# Patient Record
Sex: Male | Born: 1945 | Race: White | Hispanic: No | Marital: Married | Smoking: Never smoker
Health system: Southern US, Community
[De-identification: ages and names within clinical notes are randomized; demographics above are authoritative.]

## PROBLEM LIST (undated history)

## (undated) DIAGNOSIS — E785 Hyperlipidemia, unspecified: Secondary | ICD-10-CM

## (undated) DIAGNOSIS — T7840XA Allergy, unspecified, initial encounter: Secondary | ICD-10-CM

## (undated) DIAGNOSIS — E119 Type 2 diabetes mellitus without complications: Secondary | ICD-10-CM

## (undated) DIAGNOSIS — I10 Essential (primary) hypertension: Secondary | ICD-10-CM

## (undated) HISTORY — DX: Allergy, unspecified, initial encounter: T78.40XA

## (undated) HISTORY — DX: Essential (primary) hypertension: I10

## (undated) HISTORY — PX: APPENDECTOMY: SHX54

## (undated) HISTORY — PX: TONSILLECTOMY: SUR1361

## (undated) HISTORY — PX: COLON SURGERY: SHX602

## (undated) HISTORY — DX: Type 2 diabetes mellitus without complications: E11.9

## (undated) HISTORY — DX: Hyperlipidemia, unspecified: E78.5

---

## 2007-03-26 ENCOUNTER — Ambulatory Visit: Payer: Self-pay | Admitting: Gastroenterology

## 2013-04-11 DIAGNOSIS — A419 Sepsis, unspecified organism: Secondary | ICD-10-CM | POA: Insufficient documentation

## 2014-03-31 ENCOUNTER — Ambulatory Visit: Payer: Self-pay

## 2015-04-28 ENCOUNTER — Other Ambulatory Visit: Payer: Self-pay | Admitting: Family Medicine

## 2015-06-10 DIAGNOSIS — E118 Type 2 diabetes mellitus with unspecified complications: Secondary | ICD-10-CM | POA: Insufficient documentation

## 2015-06-10 DIAGNOSIS — E785 Hyperlipidemia, unspecified: Secondary | ICD-10-CM | POA: Insufficient documentation

## 2015-06-10 DIAGNOSIS — I10 Essential (primary) hypertension: Secondary | ICD-10-CM | POA: Insufficient documentation

## 2015-06-10 DIAGNOSIS — E119 Type 2 diabetes mellitus without complications: Secondary | ICD-10-CM | POA: Insufficient documentation

## 2015-06-11 ENCOUNTER — Ambulatory Visit (INDEPENDENT_AMBULATORY_CARE_PROVIDER_SITE_OTHER): Payer: BC Managed Care – PPO | Admitting: Family Medicine

## 2015-06-11 ENCOUNTER — Telehealth: Payer: Self-pay | Admitting: Family Medicine

## 2015-06-11 ENCOUNTER — Encounter: Payer: Self-pay | Admitting: Family Medicine

## 2015-06-11 ENCOUNTER — Other Ambulatory Visit: Payer: Self-pay | Admitting: Family Medicine

## 2015-06-11 VITALS — BP 112/70 | HR 61 | Temp 98.6°F | Ht 65.2 in | Wt 212.0 lb

## 2015-06-11 DIAGNOSIS — Z0000 Encounter for general adult medical examination without abnormal findings: Secondary | ICD-10-CM | POA: Diagnosis not present

## 2015-06-11 DIAGNOSIS — I10 Essential (primary) hypertension: Secondary | ICD-10-CM

## 2015-06-11 DIAGNOSIS — E119 Type 2 diabetes mellitus without complications: Secondary | ICD-10-CM

## 2015-06-11 DIAGNOSIS — E785 Hyperlipidemia, unspecified: Secondary | ICD-10-CM

## 2015-06-11 LAB — URINALYSIS, ROUTINE W REFLEX MICROSCOPIC
BILIRUBIN UA: NEGATIVE
Leukocytes, UA: NEGATIVE
NITRITE UA: NEGATIVE
Protein, UA: NEGATIVE
RBC UA: NEGATIVE
SPEC GRAV UA: 1.025 (ref 1.005–1.030)
Urobilinogen, Ur: 0.2 mg/dL (ref 0.2–1.0)
pH, UA: 5 (ref 5.0–7.5)

## 2015-06-11 LAB — BAYER DCA HB A1C WAIVED: HB A1C (BAYER DCA - WAIVED): 7.2 % — ABNORMAL HIGH (ref <7.0)

## 2015-06-11 MED ORDER — BENAZEPRIL HCL 40 MG PO TABS: 40.0000 mg | ORAL_TABLET | Freq: Every day | ORAL | Status: DC

## 2015-06-11 MED ORDER — CANAGLIFLOZIN-METFORMIN HCL 150-1000 MG PO TABS: 1.0000 | ORAL_TABLET | Freq: Two times a day (BID) | ORAL | Status: DC

## 2015-06-11 MED ORDER — GLUCOSE BLOOD VI STRP: ORAL_STRIP | Status: DC

## 2015-06-11 MED ORDER — SIMVASTATIN 20 MG PO TABS: 20.0000 mg | ORAL_TABLET | Freq: Every day | ORAL | Status: DC

## 2015-06-11 NOTE — Telephone Encounter (Signed)
Please send in his medications  Benazepril 40 mg  invokamet [id number]  One touch ultra test stips Simvastatin 20 mg  [facility] [address]

## 2015-06-11 NOTE — Assessment & Plan Note (Addendum)
The current medical regimen is effective;  continue present plan and medications. Discussed better control with lifestyle

## 2015-06-11 NOTE — Progress Notes (Signed)
BP 112/70 mmHg  Pulse 61  Temp(Src) 98.6 F (37 C)  Ht 5' 5.2" (1.656 m)  Wt 212 lb (96.163 kg)  BMI 35.07 kg/m2  SpO2 97%   Subjective:    Patient ID: Ralph Chapman, male    DOB: 03/09/1946, 69 y.o.   MRN: [id number]  HPI: Ralph Chapman is a 69 y.o. male  Chief Complaint  Patient presents with  . Annual Exam   patient doing well has lost 4 pounds continuing to exercise as having occasional blood sugars in the 70s and 80s. But no hypoglycemic spells Blood pressure doing well Cholesterol doing well no complaints from medications takes faithfully   Relevant past medical, surgical, family and social history reviewed and updated as indicated. Interim medical history since our last visit reviewed. Allergies and medications reviewed and updated.  Review of Systems  Constitutional: Negative.   HENT: Negative.   Eyes: Negative.   Respiratory: Negative.   Cardiovascular: Negative.   Gastrointestinal: Negative.   Endocrine: Negative.   Genitourinary: Negative.   Musculoskeletal: Negative.   Skin: Negative.   Allergic/Immunologic: Negative.   Neurological: Negative.   Hematological: Negative.   Psychiatric/Behavioral: Negative.     Per HPI unless specifically indicated above     Objective:    BP 112/70 mmHg  Pulse 61  Temp(Src) 98.6 F (37 C)  Ht 5' 5.2" (1.656 m)  Wt 212 lb (96.163 kg)  BMI 35.07 kg/m2  SpO2 97%  Wt Readings from Last 3 Encounters:  06/11/15 212 lb (96.163 kg)  02/12/15 216 lb (97.977 kg)    Physical Exam  Constitutional: He is oriented to person, place, and time. He appears well-developed and well-nourished.  HENT:  Head: Normocephalic and atraumatic.  Right Ear: External ear normal.  Left Ear: External ear normal.  Eyes: Conjunctivae and EOM are normal. Pupils are equal, round, and reactive to light.  Neck: Normal range of motion. Neck supple.  Cardiovascular: Normal rate, regular rhythm, normal heart sounds and intact distal  pulses.   Pulmonary/Chest: Effort normal and breath sounds normal.  Abdominal: Soft. Bowel sounds are normal. There is no splenomegaly or hepatomegaly.  Genitourinary: Rectum normal, prostate normal and penis normal.  Musculoskeletal: Normal range of motion.  Patient with 2 large corns on bunion and bunionette which were pared with 15 blade scalpel with marked relief in symptoms  Neurological: He is alert and oriented to person, place, and time. He has normal reflexes.  Skin: No rash noted. No erythema.  Psychiatric: He has a normal mood and affect. His behavior is normal. Judgment and thought content normal.    No results found for this or any previous visit.    Assessment & Plan:   Problem List Items Addressed This Visit      Cardiovascular and Mediastinum   Hypertension - Primary    The current medical regimen is effective;  continue present plan and medications.       Relevant Orders   Comprehensive metabolic panel   CBC with Differential/Platelet   Urinalysis, Routine w reflex microscopic (not at [facility])   TSH     Endocrine   Diabetes mellitus without complication    The current medical regimen is effective;  continue present plan and medications. Discussed better control with lifestyle       Relevant Orders   Bayer DCA Hb A1c Waived   CBC with Differential/Platelet   Urinalysis, Routine w reflex microscopic (not at [facility])   TSH  Other   Hyperlipidemia    The current medical regimen is effective;  continue present plan and medications.       Relevant Orders   Lipid panel   CBC with Differential/Platelet   Urinalysis, Routine w reflex microscopic (not at [facility])   TSH    Other Visit Diagnoses    PE (physical exam), annual        Relevant Orders    Comprehensive metabolic panel    Bayer DCA Hb A1c Waived    Lipid panel    CBC with Differential/Platelet    PSA    Urinalysis, Routine w reflex microscopic (not at [facility])    TSH        Follow up  plan: Return in about 3 months (around 09/10/2015), or if symptoms worsen or fail to improve, for Recheck medicines diabetes check hemoglobin A1c.

## 2015-06-11 NOTE — Assessment & Plan Note (Signed)
The current medical regimen is effective;  continue present plan and medications.  

## 2015-06-12 LAB — CBC WITH DIFFERENTIAL/PLATELET
BASOS ABS: 0.1 10*3/uL (ref 0.0–0.2)
Basos: 1 %
EOS (ABSOLUTE): 0.2 10*3/uL (ref 0.0–0.4)
Eos: 3 %
Hematocrit: 43.5 % (ref 37.5–51.0)
Hemoglobin: 14.7 g/dL (ref 12.6–17.7)
IMMATURE GRANULOCYTES: 0 %
Immature Grans (Abs): 0 10*3/uL (ref 0.0–0.1)
Lymphocytes Absolute: 1.7 10*3/uL (ref 0.7–3.1)
Lymphs: 24 %
MCH: 31.9 pg (ref 26.6–33.0)
MCHC: 33.8 g/dL (ref 31.5–35.7)
MCV: 94 fL (ref 79–97)
MONOS ABS: 0.6 10*3/uL (ref 0.1–0.9)
Monocytes: 9 %
NEUTROS PCT: 63 %
Neutrophils Absolute: 4.3 10*3/uL (ref 1.4–7.0)
Platelets: 283 10*3/uL (ref 150–379)
RBC: 4.61 x10E6/uL (ref 4.14–5.80)
RDW: 13.8 % (ref 12.3–15.4)
WBC: 6.9 10*3/uL (ref 3.4–10.8)

## 2015-06-12 LAB — LIPID PANEL
CHOL/HDL RATIO: 3.1 ratio (ref 0.0–5.0)
Cholesterol, Total: 163 mg/dL (ref 100–199)
HDL: 52 mg/dL (ref >39)
LDL Calculated: 86 mg/dL (ref 0–99)
Triglycerides: 124 mg/dL (ref 0–149)
VLDL CHOLESTEROL CAL: 25 mg/dL (ref 5–40)

## 2015-06-12 LAB — COMPREHENSIVE METABOLIC PANEL
ALBUMIN: 4.6 g/dL (ref 3.6–4.8)
ALT: 14 IU/L (ref 0–44)
AST: 20 IU/L (ref 0–40)
Albumin/Globulin Ratio: 2.2 (ref 1.1–2.5)
Alkaline Phosphatase: 64 IU/L (ref 39–117)
BILIRUBIN TOTAL: 0.8 mg/dL (ref 0.0–1.2)
BUN / CREAT RATIO: 18 (ref 10–22)
BUN: 17 mg/dL (ref 8–27)
CHLORIDE: 102 mmol/L (ref 97–108)
CO2: 21 mmol/L (ref 18–29)
Calcium: 9.7 mg/dL (ref 8.6–10.2)
Creatinine, Ser: 0.93 mg/dL (ref 0.76–1.27)
GFR calc non Af Amer: 85 mL/min/{1.73_m2} (ref >59)
GFR, EST AFRICAN AMERICAN: 98 mL/min/{1.73_m2} (ref >59)
Globulin, Total: 2.1 g/dL (ref 1.5–4.5)
Glucose: 88 mg/dL (ref 65–99)
Potassium: 4.8 mmol/L (ref 3.5–5.2)
Sodium: 142 mmol/L (ref 134–144)
TOTAL PROTEIN: 6.7 g/dL (ref 6.0–8.5)

## 2015-06-12 LAB — PSA: PROSTATE SPECIFIC AG, SERUM: 4.2 ng/mL — AB (ref 0.0–4.0)

## 2015-06-12 LAB — TSH: TSH: 1.6 u[IU]/mL (ref 0.450–4.500)

## 2015-06-14 ENCOUNTER — Encounter: Payer: Self-pay | Admitting: Family Medicine

## 2015-07-18 IMAGING — CR DG CHEST 2V
1 series · 2 of 2 positions shown · non-contrast
Comparison: None.

CLINICAL DATA: Cough.  History of pneumonia.

EXAM:
CHEST  2 VIEW

[Series 1: pa · 0.17mm/px · 2 of 2 slices shown]
[im 1/2]
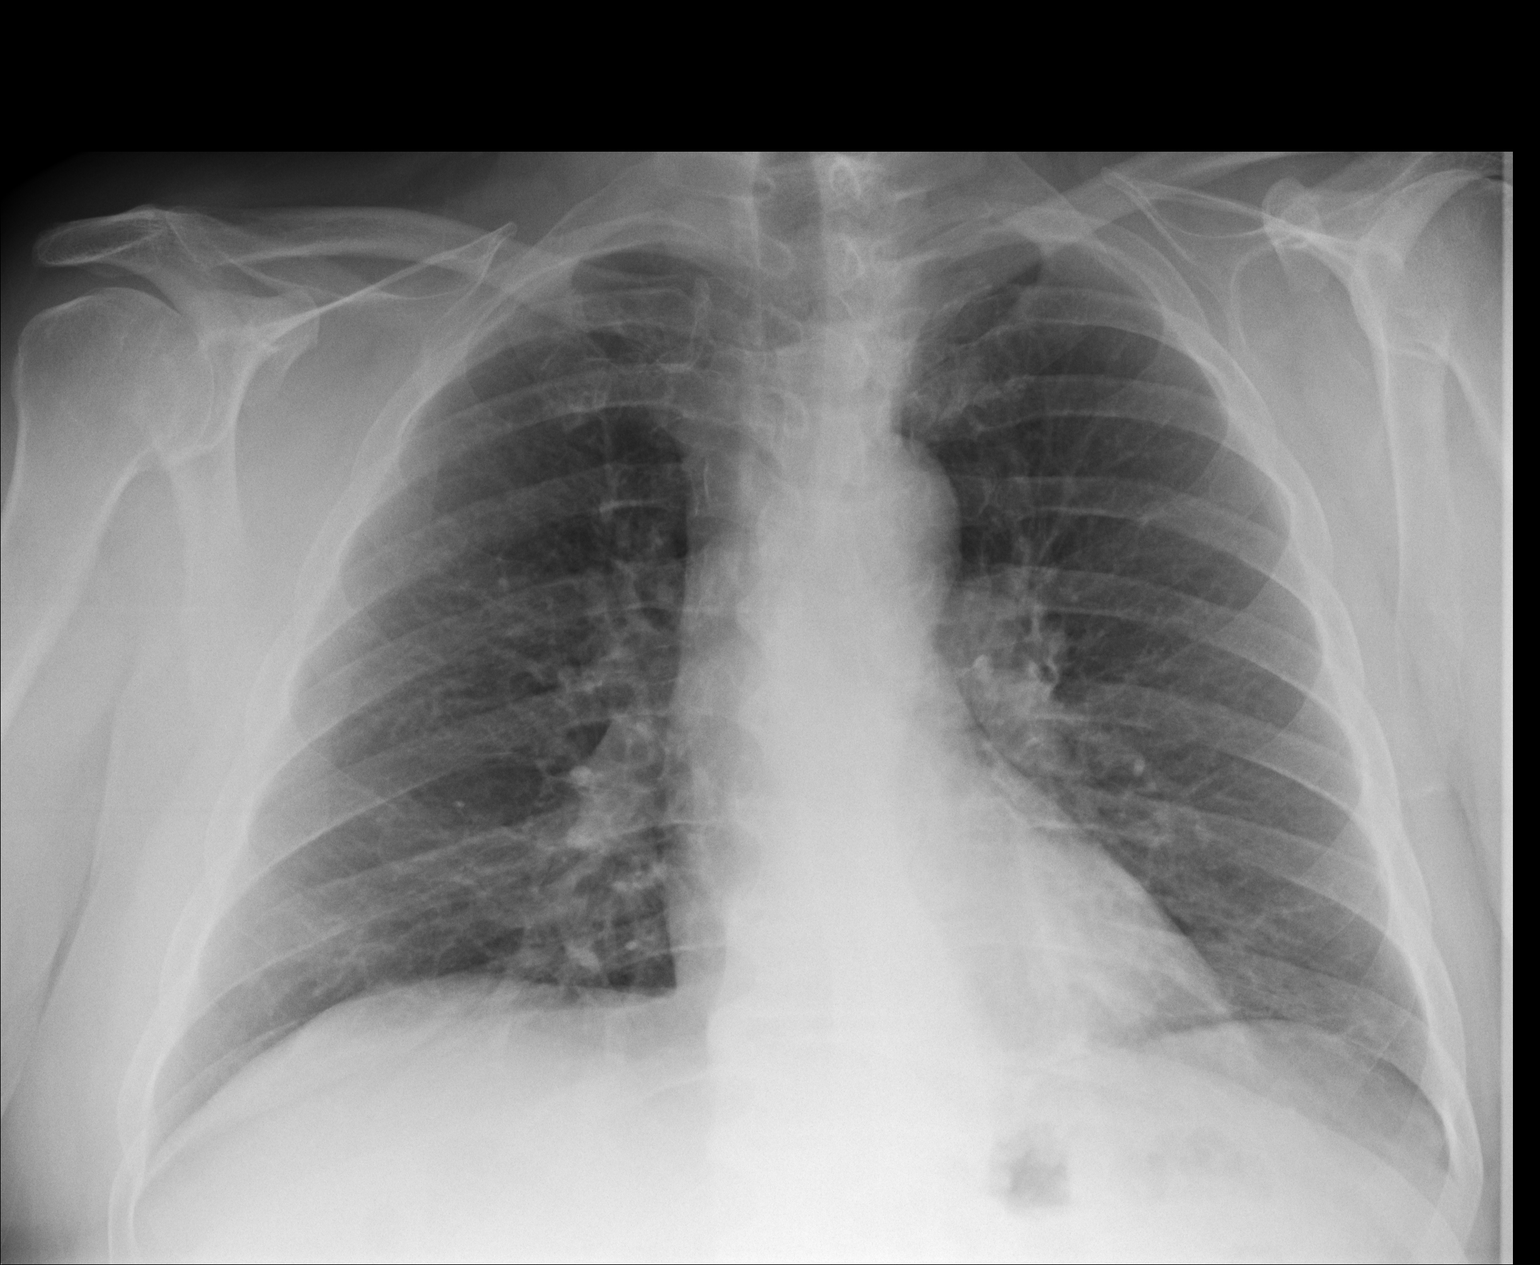
[im 2/2]
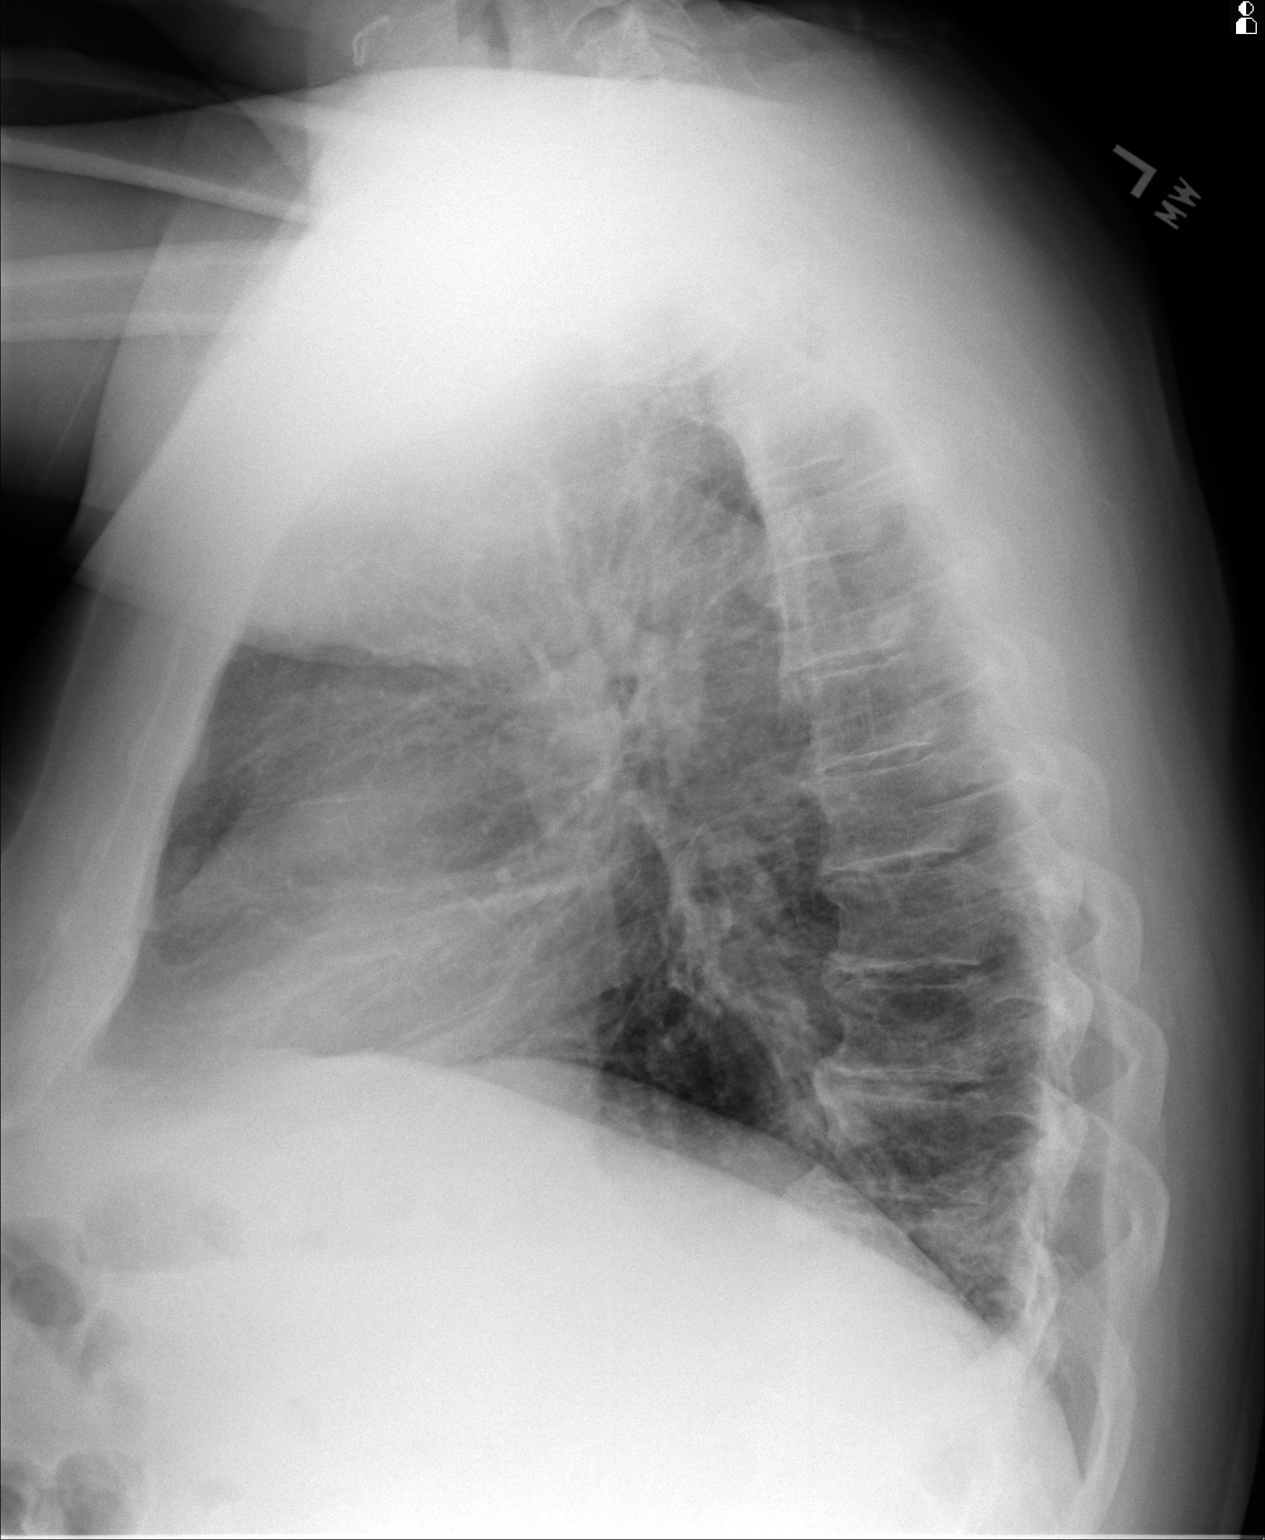

[2 of 2 positions shown; findings below may reference images not displayed]

FINDINGS: Cardiac silhouette is normal in size. Aorta is tortuous. Small
hiatal hernia is suggested. No mediastinal or hilar masses or
evidence of adenopathy.

Clear lungs.  No pleural effusion or pneumothorax.

Bony thorax is demineralized but intact.
IMPRESSION: No acute cardiopulmonary disease.

## 2015-09-15 ENCOUNTER — Encounter: Payer: Self-pay | Admitting: Family Medicine

## 2015-09-15 ENCOUNTER — Ambulatory Visit (INDEPENDENT_AMBULATORY_CARE_PROVIDER_SITE_OTHER): Payer: BC Managed Care – PPO | Admitting: Family Medicine

## 2015-09-15 VITALS — BP 123/75 | HR 67 | Temp 98.5°F | Ht 65.0 in | Wt 220.0 lb

## 2015-09-15 DIAGNOSIS — E785 Hyperlipidemia, unspecified: Secondary | ICD-10-CM

## 2015-09-15 DIAGNOSIS — E119 Type 2 diabetes mellitus without complications: Secondary | ICD-10-CM | POA: Diagnosis not present

## 2015-09-15 DIAGNOSIS — I10 Essential (primary) hypertension: Secondary | ICD-10-CM

## 2015-09-15 LAB — BAYER DCA HB A1C WAIVED: HB A1C (BAYER DCA - WAIVED): 6.5 % (ref <7.0)

## 2015-09-15 NOTE — Assessment & Plan Note (Signed)
The current medical regimen is effective;  continue present plan and medications.  

## 2015-09-15 NOTE — Assessment & Plan Note (Addendum)
The current medical regimen is effective;  continue present plan and medications. Patient will do his homework find out which medication is more cost effective

## 2015-09-15 NOTE — Progress Notes (Signed)
BP 123/75 mmHg  Pulse 67  Temp(Src) 98.5 F (36.9 C)  Ht 5\' 5"  (1.651 m)  Wt 220 lb (99.791 kg)  BMI 36.61 kg/m2  SpO2 97%   Subjective:    Patient ID: Ralph Chapman, male    DOB: 16-Aug-1946, 69 y.o.   MRN: [id number]  HPI: Ralph Chapman is a 69 y.o. male  Chief Complaint  Patient presents with  . Diabetes     Patient doing well with medications no low blood sugar spells concerned insurance is changing this winter will need to change medication Cholesterol doing well no complaints from medication taking medications faithfully  Relevant past medical, surgical, family and social history reviewed and updated as indicated. Interim medical history since our last visit reviewed. Allergies and medications reviewed and updated.  Review of Systems  Constitutional: Negative.   Respiratory: Negative.   Cardiovascular: Negative.     Per HPI unless specifically indicated above     Objective:    BP 123/75 mmHg  Pulse 67  Temp(Src) 98.5 F (36.9 C)  Ht 5\' 5"  (1.651 m)  Wt 220 lb (99.791 kg)  BMI 36.61 kg/m2  SpO2 97%  Wt Readings from Last 3 Encounters:  09/15/15 220 lb (99.791 kg)  06/11/15 212 lb (96.163 kg)  02/12/15 216 lb (97.977 kg)    Physical Exam  Constitutional: He is oriented to person, place, and time. He appears well-developed and well-nourished. No distress.  HENT:  Head: Normocephalic and atraumatic.  Right Ear: Hearing normal.  Left Ear: Hearing normal.  Nose: Nose normal.  Eyes: Conjunctivae and lids are normal. Right eye exhibits no discharge. Left eye exhibits no discharge. No scleral icterus.  Cardiovascular: Normal rate, regular rhythm and normal heart sounds.   Pulmonary/Chest: Effort normal and breath sounds normal. No respiratory distress.  Musculoskeletal: Normal range of motion.  Neurological: He is alert and oriented to person, place, and time.  Skin: Skin is intact. No rash noted.  Psychiatric: He has a normal mood and affect. His  speech is normal and behavior is normal. Judgment and thought content normal. Cognition and memory are normal.    Results for orders placed or performed in visit on 06/11/15  Comprehensive metabolic panel  Result Value Ref Range   Glucose 88 65 - 99 mg/dL   BUN 17 8 - 27 mg/dL   Creatinine, Ser 0.93 0.76 - 1.27 mg/dL   GFR calc non Af Amer 85 >59 mL/min/1.73   GFR calc Af Amer 98 >59 mL/min/1.73   BUN/Creatinine Ratio 18 10 - 22   Sodium 142 134 - 144 mmol/L   Potassium 4.8 3.5 - 5.2 mmol/L   Chloride 102 97 - 108 mmol/L   CO2 21 18 - 29 mmol/L   Calcium 9.7 8.6 - 10.2 mg/dL   Total Protein 6.7 6.0 - 8.5 g/dL   Albumin 4.6 3.6 - 4.8 g/dL   Globulin, Total 2.1 1.5 - 4.5 g/dL   Albumin/Globulin Ratio 2.2 1.1 - 2.5   Bilirubin Total 0.8 0.0 - 1.2 mg/dL   Alkaline Phosphatase 64 39 - 117 IU/L   AST 20 0 - 40 IU/L   ALT 14 0 - 44 IU/L  Bayer DCA Hb A1c Waived  Result Value Ref Range   Bayer DCA Hb A1c Waived 7.2 (H) <7.0 %  Lipid panel  Result Value Ref Range   Cholesterol, Total 163 100 - 199 mg/dL   Triglycerides 124 0 - 149 mg/dL   HDL 52 >39  mg/dL   VLDL Cholesterol Cal 25 5 - 40 mg/dL   LDL Calculated 86 0 - 99 mg/dL   Chol/HDL Ratio 3.1 0.0 - 5.0 ratio units  CBC with Differential/Platelet  Result Value Ref Range   WBC 6.9 3.4 - 10.8 x10E3/uL   RBC 4.61 4.14 - 5.80 x10E6/uL   Hemoglobin 14.7 12.6 - 17.7 g/dL   Hematocrit 43.5 37.5 - 51.0 %   MCV 94 79 - 97 fL   MCH 31.9 26.6 - 33.0 pg   MCHC 33.8 31.5 - 35.7 g/dL   RDW 13.8 12.3 - 15.4 %   Platelets 283 150 - 379 x10E3/uL   Neutrophils 63 %   Lymphs 24 %   Monocytes 9 %   Eos 3 %   Basos 1 %   Neutrophils Absolute 4.3 1.4 - 7.0 x10E3/uL   Lymphocytes Absolute 1.7 0.7 - 3.1 x10E3/uL   Monocytes Absolute 0.6 0.1 - 0.9 x10E3/uL   EOS (ABSOLUTE) 0.2 0.0 - 0.4 x10E3/uL   Basophils Absolute 0.1 0.0 - 0.2 x10E3/uL   Immature Granulocytes 0 %   Immature Grans (Abs) 0.0 0.0 - 0.1 x10E3/uL  PSA  Result Value Ref  Range   Prostate Specific Ag, Serum 4.2 (H) 0.0 - 4.0 ng/mL  Urinalysis, Routine w reflex microscopic (not at [facility])  Result Value Ref Range   Specific Gravity, UA 1.025 1.005 - 1.030   pH, UA 5.0 5.0 - 7.5   Color, UA Yellow Yellow   Appearance Ur Clear Clear   Leukocytes, UA Negative Negative   Protein, UA Negative Negative/Trace   Glucose, UA 3+ (A) Negative   Ketones, UA 1+ (A) Negative   RBC, UA Negative Negative   Bilirubin, UA Negative Negative   Urobilinogen, Ur 0.2 0.2 - 1.0 mg/dL   Nitrite, UA Negative Negative  TSH  Result Value Ref Range   TSH 1.600 0.450 - 4.500 uIU/mL      Assessment & Plan:   Problem List Items Addressed This Visit      Cardiovascular and Mediastinum   Hypertension    The current medical regimen is effective;  continue present plan and medications.         Endocrine   Diabetes mellitus without complication ([place]) - Primary    The current medical regimen is effective;  continue present plan and medications. Patient will do his homework find out which medication is more cost effective      Relevant Orders   Bayer DCA Hb A1c Waived     Other   Hyperlipidemia    The current medical regimen is effective;  continue present plan and medications.           Follow up plan: Return in about 3 months (around 12/14/2015) for A1C lipids, alt, ast, BMP.

## 2015-10-27 ENCOUNTER — Telehealth: Payer: Self-pay | Admitting: Family Medicine

## 2015-10-27 NOTE — Telephone Encounter (Signed)
Pt needs prior auth on invokamet. Number to call is [phone]

## 2015-10-29 NOTE — Telephone Encounter (Signed)
I've tried calling patient and pharmacy. No answer by patient and on hold for long time with pharmacy. I'm going to do the PA without the form from the pharmacy, but I need his new insurance card information for the new year.  Specifically the Rx Bin and Member ID number.  He's no longer active with the insurance information we have on file.

## 2015-10-30 NOTE — Telephone Encounter (Signed)
Pt's called stated he needs this medication today.   His current insurance is as follows[facility]orella [facility]rod [facility] Group # [id number] Subscriber# [id number]   New insurance information starting Feb.1, 2017:  [facility] Medicare Group# [id number] Subscriber # [id number]   Pt asks if someone can call him when this is completed as he needs this medication ASAP.  Thanks.

## 2015-11-02 NOTE — Telephone Encounter (Signed)
Patient received coupon. I still did a PA. Result is pending.

## 2015-12-15 ENCOUNTER — Ambulatory Visit (INDEPENDENT_AMBULATORY_CARE_PROVIDER_SITE_OTHER): Payer: Medicare Other | Admitting: Family Medicine

## 2015-12-15 ENCOUNTER — Encounter: Payer: Self-pay | Admitting: Family Medicine

## 2015-12-15 VITALS — BP 110/74 | HR 70 | Temp 98.9°F | Ht 65.4 in | Wt 218.0 lb

## 2015-12-15 DIAGNOSIS — E119 Type 2 diabetes mellitus without complications: Secondary | ICD-10-CM

## 2015-12-15 DIAGNOSIS — I10 Essential (primary) hypertension: Secondary | ICD-10-CM

## 2015-12-15 DIAGNOSIS — Z113 Encounter for screening for infections with a predominantly sexual mode of transmission: Secondary | ICD-10-CM | POA: Diagnosis not present

## 2015-12-15 DIAGNOSIS — E785 Hyperlipidemia, unspecified: Secondary | ICD-10-CM

## 2015-12-15 LAB — LP+ALT+AST PICCOLO, WAIVED
ALT (SGPT) Piccolo, Waived: 18 U/L (ref 10–47)
AST (SGOT) Piccolo, Waived: 27 U/L (ref 11–38)
CHOL/HDL RATIO PICCOLO,WAIVE: 2.9 mg/dL
CHOLESTEROL PICCOLO, WAIVED: 155 mg/dL (ref <200)
HDL Chol Piccolo, Waived: 53 mg/dL — ABNORMAL LOW (ref >59)
LDL CHOL CALC PICCOLO WAIVED: 77 mg/dL (ref <100)
Triglycerides Piccolo,Waived: 125 mg/dL (ref <150)
VLDL CHOL CALC PICCOLO,WAIVE: 25 mg/dL (ref <30)

## 2015-12-15 LAB — BAYER DCA HB A1C WAIVED: HB A1C: 7 % — AB (ref <7.0)

## 2015-12-15 NOTE — Assessment & Plan Note (Signed)
The current medical regimen is effective;  continue present plan and medications.  

## 2015-12-15 NOTE — Progress Notes (Signed)
BP 110/74 mmHg  Pulse 70  Temp(Src) 98.9 F (37.2 C)  Ht 5' 5.4" (1.661 m)  Wt 218 lb (98.884 kg)  BMI 35.84 kg/m2  SpO2 97%   Subjective:    Patient ID: Ralph Chapman, male    DOB: 11-18-1945, 70 y.o.   MRN: [id number]  HPI: Ralph Chapman is a 70 y.o. male  Chief Complaint  Patient presents with  . Diabetes  . Hypertension  . Hyperlipidemia   patient doing well with no complaints taking medications no side effects taking faithfully good blood pressure good blood sugar readings No myalgias from cholesterol medicines  Relevant past medical, surgical, family and social history reviewed and updated as indicated. Interim medical history since our last visit reviewed. Allergies and medications reviewed and updated.  Review of Systems  Constitutional: Negative.   Respiratory: Negative.   Cardiovascular: Negative.     Per HPI unless specifically indicated above     Objective:    BP 110/74 mmHg  Pulse 70  Temp(Src) 98.9 F (37.2 C)  Ht 5' 5.4" (1.661 m)  Wt 218 lb (98.884 kg)  BMI 35.84 kg/m2  SpO2 97%  Wt Readings from Last 3 Encounters:  12/15/15 218 lb (98.884 kg)  09/15/15 220 lb (99.791 kg)  06/11/15 212 lb (96.163 kg)    Physical Exam  Constitutional: He is oriented to person, place, and time. He appears well-developed and well-nourished. No distress.  HENT:  Head: Normocephalic and atraumatic.  Right Ear: Hearing normal.  Left Ear: Hearing normal.  Nose: Nose normal.  Eyes: Conjunctivae and lids are normal. Right eye exhibits no discharge. Left eye exhibits no discharge. No scleral icterus.  Cardiovascular: Normal rate, regular rhythm and normal heart sounds.   Pulmonary/Chest: Effort normal and breath sounds normal. No respiratory distress.  Musculoskeletal: Normal range of motion.  Neurological: He is alert and oriented to person, place, and time.  Skin: Skin is intact. No rash noted.  Psychiatric: He has a normal mood and affect. His speech is  normal and behavior is normal. Judgment and thought content normal. Cognition and memory are normal.    Results for orders placed or performed in visit on 09/15/15  Bayer DCA Hb A1c Waived  Result Value Ref Range   Bayer DCA Hb A1c Waived 6.5 <7.0 %      Assessment & Plan:   Problem List Items Addressed This Visit      Cardiovascular and Mediastinum   Hypertension    The current medical regimen is effective;  continue present plan and medications.       Relevant Orders   Bayer DCA Hb A1c Waived   LP+ALT+AST [place], Waived   Basic metabolic panel     Endocrine   Diabetes mellitus without complication ([place]) - Primary    The current medical regimen is effective;  continue present plan and medications.       Relevant Orders   Bayer DCA Hb A1c Waived   LP+ALT+AST [place], Waived   Basic metabolic panel     Other   Hyperlipidemia    The current medical regimen is effective;  continue present plan and medications.       Relevant Orders   Bayer DCA Hb A1c Waived   LP+ALT+AST [place], Waived   Basic metabolic panel    Other Visit Diagnoses    Routine screening for STI (sexually transmitted infection)        Relevant Orders    Hepatitis C Antibody  Follow up plan: Return in about 3 months (around 03/16/2016), or if symptoms worsen or fail to improve, for A1c.

## 2015-12-16 ENCOUNTER — Encounter: Payer: Self-pay | Admitting: Family Medicine

## 2015-12-16 LAB — BASIC METABOLIC PANEL
BUN/Creatinine Ratio: 20 (ref 10–22)
BUN: 19 mg/dL (ref 8–27)
CO2: 21 mmol/L (ref 18–29)
CREATININE: 0.94 mg/dL (ref 0.76–1.27)
Calcium: 9.5 mg/dL (ref 8.6–10.2)
Chloride: 104 mmol/L (ref 96–106)
GFR calc Af Amer: 96 mL/min/{1.73_m2} (ref >59)
GFR, EST NON AFRICAN AMERICAN: 83 mL/min/{1.73_m2} (ref >59)
Glucose: 137 mg/dL — ABNORMAL HIGH (ref 65–99)
Potassium: 4.8 mmol/L (ref 3.5–5.2)
SODIUM: 143 mmol/L (ref 134–144)

## 2015-12-16 LAB — HEPATITIS C ANTIBODY: Hep C Virus Ab: <0.1 s/co ratio (ref 0.0–0.9)

## 2016-03-08 LAB — HM DIABETES EYE EXAM

## 2016-03-16 ENCOUNTER — Encounter: Payer: Self-pay | Admitting: Family Medicine

## 2016-03-16 ENCOUNTER — Ambulatory Visit (INDEPENDENT_AMBULATORY_CARE_PROVIDER_SITE_OTHER): Payer: Medicare Other | Admitting: Family Medicine

## 2016-03-16 VITALS — BP 108/69 | HR 67 | Temp 98.1°F | Ht 65.4 in | Wt 215.0 lb

## 2016-03-16 DIAGNOSIS — E785 Hyperlipidemia, unspecified: Secondary | ICD-10-CM | POA: Diagnosis not present

## 2016-03-16 DIAGNOSIS — E119 Type 2 diabetes mellitus without complications: Secondary | ICD-10-CM | POA: Diagnosis not present

## 2016-03-16 DIAGNOSIS — I10 Essential (primary) hypertension: Secondary | ICD-10-CM

## 2016-03-16 LAB — BAYER DCA HB A1C WAIVED: HB A1C: 6.7 % (ref <7.0)

## 2016-03-16 NOTE — Assessment & Plan Note (Signed)
The current medical regimen is effective;  continue present plan and medications.  

## 2016-03-16 NOTE — Assessment & Plan Note (Signed)
Discussed treatment goals and medications patient may try decreasing Benzapril 20 mg observing blood pressure and blood pressure response.

## 2016-03-16 NOTE — Progress Notes (Signed)
   BP 108/69 mmHg  Pulse 67  Temp(Src) 98.1 F (36.7 C)  Ht 5' 5.4" (1.661 m)  Wt 215 lb (97.523 kg)  BMI 35.35 kg/m2  SpO2 98%   Subjective:    Patient ID: Ralph Chapman, male    DOB: 11/18/1945, 70 y.o.   MRN: [id number]  HPI: Ralph Chapman is a 70 y.o. male  Chief Complaint  Patient presents with  . Diabetes   Patient doing well with diabetes no complaints has had good weight loss exercising well and eating better. Feeling very good. No low blood sugar spells. Blood pressures been doing well is been low but no low blood pressure symptoms no orthostatic changes patient interested in maybe decreasing medications. Cholesterol doing well Relevant past medical, surgical, family and social history reviewed and updated as indicated. Interim medical history since our last visit reviewed. Allergies and medications reviewed and updated.  Review of Systems  Constitutional: Negative.   Respiratory: Negative.   Cardiovascular: Negative.     Per HPI unless specifically indicated above     Objective:    BP 108/69 mmHg  Pulse 67  Temp(Src) 98.1 F (36.7 C)  Ht 5' 5.4" (1.661 m)  Wt 215 lb (97.523 kg)  BMI 35.35 kg/m2  SpO2 98%  Wt Readings from Last 3 Encounters:  03/16/16 215 lb (97.523 kg)  12/15/15 218 lb (98.884 kg)  09/15/15 220 lb (99.791 kg)    Physical Exam  Constitutional: He is oriented to person, place, and time. He appears well-developed and well-nourished. No distress.  HENT:  Head: Normocephalic and atraumatic.  Right Ear: Hearing normal.  Left Ear: Hearing normal.  Nose: Nose normal.  Eyes: Conjunctivae and lids are normal. Right eye exhibits no discharge. Left eye exhibits no discharge. No scleral icterus.  Cardiovascular: Normal rate, regular rhythm and normal heart sounds.   Pulmonary/Chest: Effort normal and breath sounds normal. No respiratory distress.  Musculoskeletal: Normal range of motion.  Neurological: He is alert and oriented to person,  place, and time.  Skin: Skin is intact. No rash noted.  Psychiatric: He has a normal mood and affect. His speech is normal and behavior is normal. Judgment and thought content normal. Cognition and memory are normal.    Results for orders placed or performed in visit on 03/09/16  HM DIABETES EYE EXAM  Result Value Ref Range   HM Diabetic Eye Exam No Retinopathy No Retinopathy      Assessment & Plan:   Problem List Items Addressed This Visit      Cardiovascular and Mediastinum   Hypertension    Discussed treatment goals and medications patient may try decreasing Benzapril 20 mg observing blood pressure and blood pressure response.        Endocrine   Diabetes mellitus without complication ([place]) - Primary    The current medical regimen is effective;  continue present plan and medications.       Relevant Orders   Bayer DCA Hb A1c Waived     Other   Hyperlipidemia    The current medical regimen is effective;  continue present plan and medications.           Follow up plan: Return in about 3 months (around 06/16/2016) for Physical Exam a1c.

## 2016-08-01 ENCOUNTER — Telehealth: Payer: Self-pay

## 2016-08-01 ENCOUNTER — Encounter: Payer: Self-pay | Admitting: Family Medicine

## 2016-08-01 ENCOUNTER — Ambulatory Visit (INDEPENDENT_AMBULATORY_CARE_PROVIDER_SITE_OTHER): Payer: Medicare Other | Admitting: Family Medicine

## 2016-08-01 VITALS — BP 113/71 | HR 76 | Temp 98.3°F | Ht 66.0 in | Wt 214.0 lb

## 2016-08-01 DIAGNOSIS — E119 Type 2 diabetes mellitus without complications: Secondary | ICD-10-CM

## 2016-08-01 DIAGNOSIS — Z0000 Encounter for general adult medical examination without abnormal findings: Secondary | ICD-10-CM | POA: Diagnosis not present

## 2016-08-01 DIAGNOSIS — R195 Other fecal abnormalities: Secondary | ICD-10-CM

## 2016-08-01 DIAGNOSIS — Z23 Encounter for immunization: Secondary | ICD-10-CM | POA: Diagnosis not present

## 2016-08-01 DIAGNOSIS — I10 Essential (primary) hypertension: Secondary | ICD-10-CM

## 2016-08-01 DIAGNOSIS — E7800 Pure hypercholesterolemia, unspecified: Secondary | ICD-10-CM

## 2016-08-01 MED ORDER — SIMVASTATIN 20 MG PO TABS: 20.0000 mg | ORAL_TABLET | Freq: Every day | ORAL | 4 refills | Status: DC

## 2016-08-01 MED ORDER — BENAZEPRIL HCL 40 MG PO TABS: 40.0000 mg | ORAL_TABLET | Freq: Every day | ORAL | 4 refills | Status: DC

## 2016-08-01 MED ORDER — CANAGLIFLOZIN-METFORMIN HCL 150-1000 MG PO TABS
1.0000 | ORAL_TABLET | Freq: Two times a day (BID) | ORAL | 4 refills | Status: DC
Start: 2016-08-01 — End: 2016-11-08

## 2016-08-01 MED ORDER — BENAZEPRIL HCL 40 MG PO TABS: 40.0000 mg | ORAL_TABLET | Freq: Every day | ORAL | 12 refills | Status: DC

## 2016-08-01 MED ORDER — CANAGLIFLOZIN-METFORMIN HCL 150-1000 MG PO TABS: 1.0000 | ORAL_TABLET | Freq: Two times a day (BID) | ORAL | 12 refills | Status: DC

## 2016-08-01 MED ORDER — SIMVASTATIN 20 MG PO TABS: 20.0000 mg | ORAL_TABLET | Freq: Every day | ORAL | 12 refills | Status: DC

## 2016-08-01 NOTE — Assessment & Plan Note (Signed)
The current medical regimen is effective;  continue present plan and medications.  

## 2016-08-01 NOTE — Progress Notes (Signed)
BP 113/71   Pulse 76   Temp 98.3 F (36.8 C)   Ht _0  (1.676 m)   Wt 214 lb (97.1 kg)   SpO2 95%   BMI 34.54 kg/m    Subjective:    Patient ID: Ralph Chapman, male    DOB: 12-Nov-1945, 70 y.o.   MRN: [id number]  HPI: Ralph Chapman is a 70 y.o. male  Chief Complaint  Patient presents with  . Annual Exam  AWV metrics met Pt with insurance given Stool cards came back positive will get colonoscopy. Patient follow-up also for cholesterol doing well with no complaints from medications taken faithfully same for diabetes and blood pressure no problems with medications noted low blood sugar spells and taking faithfully. Takes glucosamine for arthritis complaints does okay. As noted above patient was giving stool for occult blood card by this in nurse for his insurance which came back positive will set up for colonoscopy. Discuss concerns about Invokanna and will leave alone patient with no leg foot issues ulcerations or risk factors. Relevant past medical, surgical, family and social history reviewed and updated as indicated. Interim medical history since our last visit reviewed. Allergies and medications reviewed and updated.  Review of Systems  Constitutional: Negative.   HENT: Negative.   Eyes: Negative.   Respiratory: Negative.   Cardiovascular: Negative.   Gastrointestinal: Negative.   Endocrine: Negative.   Genitourinary: Negative.   Musculoskeletal: Negative.   Skin: Negative.   Allergic/Immunologic: Negative.   Neurological: Negative.   Hematological: Negative.   Psychiatric/Behavioral: Negative.     Per HPI unless specifically indicated above     Objective:    BP 113/71   Pulse 76   Temp 98.3 F (36.8 C)   Ht _1  (1.676 m)   Wt 214 lb (97.1 kg)   SpO2 95%   BMI 34.54 kg/m   Wt Readings from Last 3 Encounters:  08/01/16 214 lb (97.1 kg)  03/16/16 215 lb (97.5 kg)  12/15/15 218 lb (98.9 kg)    Physical Exam  Constitutional: He is oriented to  person, place, and time. He appears well-developed and well-nourished.  HENT:  Head: Normocephalic and atraumatic.  Right Ear: External ear normal.  Left Ear: External ear normal.  Eyes: Conjunctivae and EOM are normal. Pupils are equal, round, and reactive to light.  Neck: Normal range of motion. Neck supple.  Cardiovascular: Normal rate, regular rhythm, normal heart sounds and intact distal pulses.   Pulmonary/Chest: Effort normal and breath sounds normal.  Abdominal: Soft. Bowel sounds are normal. There is no splenomegaly or hepatomegaly.  Genitourinary: Rectum normal and penis normal.  Genitourinary Comments: Prostate enlarged  Musculoskeletal: Normal range of motion.  Neurological: He is alert and oriented to person, place, and time. He has normal reflexes.  Skin: No rash noted. No erythema.  Psychiatric: He has a normal mood and affect. His behavior is normal. Judgment and thought content normal.    Results for orders placed or performed in visit on 03/16/16  Bayer DCA Hb A1c Waived  Result Value Ref Range   Bayer DCA Hb A1c Waived 6.7 <7.0 %      Assessment & Plan:   Problem List Items Addressed This Visit      Cardiovascular and Mediastinum   Hypertension    The current medical regimen is effective;  continue present plan and medications.       Relevant Medications   simvastatin (ZOCOR) 20 MG tablet   benazepril (LOTENSIN)  40 MG tablet     Endocrine   Diabetes mellitus without complication ([place])    The current medical regimen is effective;  continue present plan and medications.       Relevant Medications   Canagliflozin-Metformin HCl (INVOKAMET) [phone] MG TABS   simvastatin (ZOCOR) 20 MG tablet   benazepril (LOTENSIN) 40 MG tablet     Other   Hyperlipidemia    The current medical regimen is effective;  continue present plan and medications.       Relevant Medications   simvastatin (ZOCOR) 20 MG tablet   benazepril (LOTENSIN) 40 MG tablet   Hematest  positive stools   Relevant Orders   Ambulatory referral to Gastroenterology    Other Visit Diagnoses    Needs flu shot    -  Primary   Encounter for immunization       Relevant Orders   Flu vaccine HIGH DOSE PF (Completed)   Ambulatory referral to Gastroenterology   PE (physical exam), annual           Follow up plan: Return in about 3 months (around 11/01/2016) for Hemoglobin A1c.

## 2016-08-01 NOTE — Addendum Note (Signed)
Addended byGolden Pop on: 08/01/2016 01:40 PM   Modules accepted: Orders

## 2016-08-01 NOTE — Telephone Encounter (Signed)
Patient wants to know if he can also get a PSA checked today?

## 2016-08-01 NOTE — Telephone Encounter (Signed)
labs

## 2016-08-02 ENCOUNTER — Encounter: Payer: Self-pay | Admitting: Family Medicine

## 2016-08-02 LAB — HGB A1C W/O EAG: Hgb A1c MFr Bld: 6.9 % — ABNORMAL HIGH (ref 4.8–5.6)

## 2016-08-03 NOTE — Telephone Encounter (Signed)
Patient notified, he will come back in for labs.

## 2016-08-04 ENCOUNTER — Other Ambulatory Visit: Payer: Medicare Other

## 2016-08-04 DIAGNOSIS — E7800 Pure hypercholesterolemia, unspecified: Secondary | ICD-10-CM

## 2016-08-04 DIAGNOSIS — Z0000 Encounter for general adult medical examination without abnormal findings: Secondary | ICD-10-CM

## 2016-08-04 DIAGNOSIS — E119 Type 2 diabetes mellitus without complications: Secondary | ICD-10-CM

## 2016-08-04 DIAGNOSIS — I10 Essential (primary) hypertension: Secondary | ICD-10-CM

## 2016-08-04 LAB — URINALYSIS, ROUTINE W REFLEX MICROSCOPIC
Bilirubin, UA: NEGATIVE
KETONES UA: NEGATIVE
LEUKOCYTES UA: NEGATIVE
NITRITE UA: NEGATIVE
PROTEIN UA: NEGATIVE
RBC UA: NEGATIVE
SPEC GRAV UA: 1.02 (ref 1.005–1.030)
Urobilinogen, Ur: 0.2 mg/dL (ref 0.2–1.0)
pH, UA: 5.5 (ref 5.0–7.5)

## 2016-08-05 LAB — COMPREHENSIVE METABOLIC PANEL
A/G RATIO: 2.3 — AB (ref 1.2–2.2)
ALBUMIN: 4.8 g/dL (ref 3.6–4.8)
ALK PHOS: 72 IU/L (ref 39–117)
ALT: 14 IU/L (ref 0–44)
AST: 21 IU/L (ref 0–40)
BILIRUBIN TOTAL: 0.4 mg/dL (ref 0.0–1.2)
BUN / CREAT RATIO: 21 (ref 10–24)
BUN: 23 mg/dL (ref 8–27)
CHLORIDE: 105 mmol/L (ref 96–106)
CO2: 20 mmol/L (ref 18–29)
CREATININE: 1.07 mg/dL (ref 0.76–1.27)
Calcium: 9.5 mg/dL (ref 8.6–10.2)
GFR calc Af Amer: 82 mL/min/{1.73_m2} (ref >59)
GFR calc non Af Amer: 71 mL/min/{1.73_m2} (ref >59)
GLOBULIN, TOTAL: 2.1 g/dL (ref 1.5–4.5)
Glucose: 127 mg/dL — ABNORMAL HIGH (ref 65–99)
Potassium: 5.1 mmol/L (ref 3.5–5.2)
SODIUM: 141 mmol/L (ref 134–144)
Total Protein: 6.9 g/dL (ref 6.0–8.5)

## 2016-08-05 LAB — CBC WITH DIFFERENTIAL/PLATELET
Basophils Absolute: 0.1 10*3/uL (ref 0.0–0.2)
Basos: 2 %
EOS (ABSOLUTE): 0.3 10*3/uL (ref 0.0–0.4)
EOS: 6 %
HEMATOCRIT: 42.4 % (ref 37.5–51.0)
HEMOGLOBIN: 14.4 g/dL (ref 12.6–17.7)
Immature Grans (Abs): 0 10*3/uL (ref 0.0–0.1)
Immature Granulocytes: 0 %
LYMPHS ABS: 1.1 10*3/uL (ref 0.7–3.1)
Lymphs: 23 %
MCH: 31.9 pg (ref 26.6–33.0)
MCHC: 34 g/dL (ref 31.5–35.7)
MCV: 94 fL (ref 79–97)
MONOCYTES: 10 %
Monocytes Absolute: 0.5 10*3/uL (ref 0.1–0.9)
NEUTROS ABS: 2.9 10*3/uL (ref 1.4–7.0)
Neutrophils: 59 %
Platelets: 289 10*3/uL (ref 150–379)
RBC: 4.52 x10E6/uL (ref 4.14–5.80)
RDW: 13.7 % (ref 12.3–15.4)
WBC: 4.9 10*3/uL (ref 3.4–10.8)

## 2016-08-05 LAB — LIPID PANEL
CHOL/HDL RATIO: 3.2 ratio (ref 0.0–5.0)
Cholesterol, Total: 159 mg/dL (ref 100–199)
HDL: 50 mg/dL (ref >39)
LDL Calculated: 80 mg/dL (ref 0–99)
Triglycerides: 143 mg/dL (ref 0–149)
VLDL Cholesterol Cal: 29 mg/dL (ref 5–40)

## 2016-08-05 LAB — TSH: TSH: 2.44 u[IU]/mL (ref 0.450–4.500)

## 2016-08-05 LAB — PSA: Prostate Specific Ag, Serum: 4 ng/mL (ref 0.0–4.0)

## 2016-08-11 ENCOUNTER — Other Ambulatory Visit: Payer: Self-pay

## 2016-08-11 ENCOUNTER — Telehealth: Payer: Self-pay

## 2016-08-11 NOTE — Telephone Encounter (Signed)
Gastroenterology Pre-Procedure Review  Request Date: 08/29/2016 Requesting Physician: Dr. Jeananne Rama  PATIENT REVIEW QUESTIONS: The patient responded to the following health history questions as indicated:    1. Are you having any GI issues? Yes, blood detected in stool 2. Do you have a personal history of Polyps? yes (yes, benign) 3. Do you have a family history of Colon Cancer or Polyps? no 4. Diabetes Mellitus? yes (Type 2) 5. Joint replacements in the past 12 months?no 6. Major health problems in the past 3 months?no 7. Any artificial heart valves, MVP, or defibrillator?no    MEDICATIONS & ALLERGIES:    Patient reports the following regarding taking any anticoagulation/antiplatelet therapy:   Plavix, Coumadin, Eliquis, Xarelto, Lovenox, Pradaxa, Brilinta, or Effient? no Aspirin? no  Patient confirms/reports the following medications:  Current Outpatient Prescriptions  Medication Sig Dispense Refill  . benazepril (LOTENSIN) 40 MG tablet Take 1 tablet (40 mg total) by mouth daily. 90 tablet 4  . Canagliflozin-Metformin HCl (INVOKAMET) [phone] MG TABS Take 1 tablet by mouth 2 (two) times daily. 90 tablet 4  . glucosamine-chondroitin (GLUCOSAMINE-CHONDROITIN DS) 500-400 MG tablet Take by mouth.    Marland Kitchen glucose blood (ONE TOUCH ULTRA TEST) test strip USE TO CHECK BLOOD GLUCOSE TWICE DAILY 100 each 12  . simvastatin (ZOCOR) 20 MG tablet Take 1 tablet (20 mg total) by mouth daily. 90 tablet 4   No current facility-administered medications for this visit.     Patient confirms/reports the following allergies:  Allergies  Allergen Reactions  . Aspirin   . Niacin And Related Swelling  . Tramadol Nausea And Vomiting    No orders of the defined types were placed in this encounter.   AUTHORIZATION INFORMATION Primary Insurance: 1D#: Group #:  Secondary Insurance: 1D#: Group #:  SCHEDULE INFORMATION: Date: 08/29/2016 Time: Location:[facility]

## 2016-08-11 NOTE — Telephone Encounter (Signed)
Screening Colonoscopy Z12.11 [facility] 08/29/2016 Dr. Allen Norris [facility] Pre cert

## 2016-08-11 NOTE — Telephone Encounter (Signed)
The notification/prior authorization case information was transmitted on 08/11/2016 at 3:33 PM CDT. The notification/prior authorization reference number is [id number]. Please print this page for your records.

## 2016-08-22 ENCOUNTER — Encounter: Payer: Self-pay | Admitting: *Deleted

## 2016-08-31 NOTE — Discharge Instructions (Signed)
General Anesthesia, Adult, Care After °These instructions provide you with information about caring for yourself after your procedure. Your health care provider may also give you more specific instructions. Your treatment has been planned according to current medical practices, but problems sometimes occur. Call your health care provider if you have any problems or questions after your procedure. °What can I expect after the procedure? °After the procedure, it is common to have: °· Vomiting. °· A sore throat. °· Mental slowness. °It is common to feel: °· Nauseous. °· Cold or shivery. °· Sleepy. °· Tired. °· Sore or achy, even in parts of your body where you did not have surgery. °Follow these instructions at home: °For at least 24 hours after the procedure:  °· Do not: °¨ Participate in activities where you could fall or become injured. °¨ Drive. °¨ Use heavy machinery. °¨ Drink alcohol. °¨ Take sleeping pills or medicines that cause drowsiness. °¨ Make important decisions or sign legal documents. °¨ Take care of children on your own. °· Rest. °Eating and drinking  °· If you vomit, drink water, juice, or soup when you can drink without vomiting. °· Drink enough fluid to keep your urine clear or pale yellow. °· Make sure you have little or no nausea before eating solid foods. °· Follow the diet recommended by your health care provider. °General instructions  °· Have a responsible adult stay with you until you are awake and alert. °· Return to your normal activities as told by your health care provider. Ask your health care provider what activities are safe for you. °· Take over-the-counter and prescription medicines only as told by your health care provider. °· If you smoke, do not smoke without supervision. °· Keep all follow-up visits as told by your health care provider. This is important. °Contact a health care provider if: °· You continue to have nausea or vomiting at home, and medicines are not helpful. °· You  cannot drink fluids or start eating again. °· You cannot urinate after 8-12 hours. °· You develop a skin rash. °· You have fever. °· You have increasing redness at the site of your procedure. °Get help right away if: °· You have difficulty breathing. °· You have chest pain. °· You have unexpected bleeding. °· You feel that you are having a life-threatening or urgent problem. °This information is not intended to replace advice given to you by your health care provider. Make sure you discuss any questions you have with your health care provider. °Document Released: 01/02/2001 Document Revised: 02/29/2016 Document Reviewed: 09/10/2015 °Elsevier Interactive Patient Education © 2017 [employer] ° ° °General Anesthesia, Adult, Care After °These instructions provide you with information about caring for yourself after your procedure. Your health care provider may also give you more specific instructions. Your treatment has been planned according to current medical practices, but problems sometimes occur. Call your health care provider if you have any problems or questions after your procedure. °What can I expect after the procedure? °After the procedure, it is common to have: °· Vomiting. °· A sore throat. °· Mental slowness. °It is common to feel: °· Nauseous. °· Cold or shivery. °· Sleepy. °· Tired. °· Sore or achy, even in parts of your body where you did not have surgery. °Follow these instructions at home: °For at least 24 hours after the procedure:  °· Do not: °¨ Participate in activities where you could fall or become injured. °¨ Drive. °¨ Use heavy machinery. °¨ Drink alcohol. °¨   Take sleeping pills or medicines that cause drowsiness. °¨ Make important decisions or sign legal documents. °¨ Take care of children on your own. °· Rest. °Eating and drinking  °· If you vomit, drink water, juice, or soup when you can drink without vomiting. °· Drink enough fluid to keep your urine clear or pale yellow. °· Make sure you  have little or no nausea before eating solid foods. °· Follow the diet recommended by your health care provider. °General instructions  °· Have a responsible adult stay with you until you are awake and alert. °· Return to your normal activities as told by your health care provider. Ask your health care provider what activities are safe for you. °· Take over-the-counter and prescription medicines only as told by your health care provider. °· If you smoke, do not smoke without supervision. °· Keep all follow-up visits as told by your health care provider. This is important. °Contact a health care provider if: °· You continue to have nausea or vomiting at home, and medicines are not helpful. °· You cannot drink fluids or start eating again. °· You cannot urinate after 8-12 hours. °· You develop a skin rash. °· You have fever. °· You have increasing redness at the site of your procedure. °Get help right away if: °· You have difficulty breathing. °· You have chest pain. °· You have unexpected bleeding. °· You feel that you are having a life-threatening or urgent problem. °This information is not intended to replace advice given to you by your health care provider. Make sure you discuss any questions you have with your health care provider. °Document Released: 01/02/2001 Document Revised: 02/29/2016 Document Reviewed: 09/10/2015 °Elsevier Interactive Patient Education © 2017 Elsevier Inc. ° °

## 2016-09-05 ENCOUNTER — Ambulatory Visit: Payer: Medicare Other | Admitting: Anesthesiology

## 2016-09-05 ENCOUNTER — Other Ambulatory Visit: Payer: Self-pay | Admitting: Family Medicine

## 2016-09-05 ENCOUNTER — Encounter
Admission: RE | Disposition: A | Discharge status: Home / Self Care | Payer: Self-pay | Source: Ambulatory Visit | Attending: Gastroenterology

## 2016-09-05 ENCOUNTER — Ambulatory Visit
Admission: RE | Admit: 2016-09-05 | Discharge: 2016-09-05 | Disposition: A | Discharge status: Home / Self Care | Payer: Medicare Other | Source: Ambulatory Visit | Attending: Gastroenterology | Admitting: Gastroenterology

## 2016-09-05 DIAGNOSIS — I10 Essential (primary) hypertension: Secondary | ICD-10-CM | POA: Diagnosis not present

## 2016-09-05 DIAGNOSIS — D122 Benign neoplasm of ascending colon: Secondary | ICD-10-CM | POA: Diagnosis not present

## 2016-09-05 DIAGNOSIS — K621 Rectal polyp: Secondary | ICD-10-CM | POA: Diagnosis not present

## 2016-09-05 DIAGNOSIS — Z79899 Other long term (current) drug therapy: Secondary | ICD-10-CM | POA: Insufficient documentation

## 2016-09-05 DIAGNOSIS — D123 Benign neoplasm of transverse colon: Secondary | ICD-10-CM

## 2016-09-05 DIAGNOSIS — Z7984 Long term (current) use of oral hypoglycemic drugs: Secondary | ICD-10-CM | POA: Insufficient documentation

## 2016-09-05 DIAGNOSIS — D120 Benign neoplasm of cecum: Secondary | ICD-10-CM

## 2016-09-05 DIAGNOSIS — E785 Hyperlipidemia, unspecified: Secondary | ICD-10-CM | POA: Diagnosis not present

## 2016-09-05 DIAGNOSIS — Z1211 Encounter for screening for malignant neoplasm of colon: Secondary | ICD-10-CM | POA: Diagnosis not present

## 2016-09-05 DIAGNOSIS — C20 Malignant neoplasm of rectum: Secondary | ICD-10-CM | POA: Insufficient documentation

## 2016-09-05 DIAGNOSIS — Z860100 Personal history of colon polyps, unspecified: Secondary | ICD-10-CM

## 2016-09-05 DIAGNOSIS — E119 Type 2 diabetes mellitus without complications: Secondary | ICD-10-CM | POA: Diagnosis not present

## 2016-09-05 HISTORY — PX: POLYPECTOMY: SHX5525

## 2016-09-05 HISTORY — PX: COLONOSCOPY WITH PROPOFOL: SHX5780

## 2016-09-05 LAB — GLUCOSE, CAPILLARY
GLUCOSE-CAPILLARY: 112 mg/dL — AB (ref 65–99)
Glucose-Capillary: 95 mg/dL (ref 65–99)

## 2016-09-05 SURGERY — COLONOSCOPY WITH PROPOFOL
Anesthesia: Monitor Anesthesia Care | Wound class: Contaminated

## 2016-09-05 MED ORDER — LACTATED RINGERS IV SOLN
INTRAVENOUS | Status: DC
  Administered 2016-09-05: 07:00:00 via INTRAVENOUS

## 2016-09-05 MED ORDER — SODIUM CHLORIDE 0.9 % IJ SOLN
INTRAMUSCULAR | Status: DC | PRN
  Administered 2016-09-05: 1 mL

## 2016-09-05 MED ORDER — PROPOFOL 10 MG/ML IV BOLUS
INTRAVENOUS | Status: DC | PRN
  Administered 2016-09-05 (×5): 50 mg via INTRAVENOUS
  Administered 2016-09-05: 70 mg via INTRAVENOUS
  Administered 2016-09-05 (×2): 50 mg via INTRAVENOUS
  Administered 2016-09-05: 30 mg via INTRAVENOUS

## 2016-09-05 MED ORDER — STERILE WATER FOR IRRIGATION IR SOLN
Status: DC | PRN
  Administered 2016-09-05: 08:00:00

## 2016-09-05 MED ORDER — LIDOCAINE HCL (CARDIAC) 20 MG/ML IV SOLN
INTRAVENOUS | Status: DC | PRN
  Administered 2016-09-05: 40 mg via INTRAVENOUS

## 2016-09-05 SURGICAL SUPPLY — 23 items
CANISTER SUCT 1200ML W/VALVE (MISCELLANEOUS) ×3 IMPLANT
CLIP HMST 235XBRD CATH ROT (MISCELLANEOUS) ×12 IMPLANT
CLIP RESOLUTION 360 11X235 (MISCELLANEOUS) ×6
FCP ESCP3.2XJMB 240X2.8X (MISCELLANEOUS)
FORCEPS BIOP RAD 4 LRG CAP 4 (CUTTING FORCEPS) ×3 IMPLANT
FORCEPS BIOP RJ4 240 W/NDL (MISCELLANEOUS)
FORCEPS ESCP3.2XJMB 240X2.8X (MISCELLANEOUS) IMPLANT
GOWN CVR UNV OPN BCK APRN NK (MISCELLANEOUS) ×4 IMPLANT
GOWN ISOL THUMB LOOP REG UNIV (MISCELLANEOUS) ×2
INJECTOR VARIJECT VIN23 (MISCELLANEOUS) ×2 IMPLANT
KIT DEFENDO VALVE AND CONN (KITS) IMPLANT
KIT ENDO PROCEDURE OLY (KITS) ×3 IMPLANT
MARKER SPOT ENDO TATTOO 5ML (MISCELLANEOUS) IMPLANT
PAD GROUND ADULT SPLIT (MISCELLANEOUS) ×3 IMPLANT
PROBE APC STR FIRE (PROBE) IMPLANT
RETRIEVER NET ROTH 2.5X230 LF (MISCELLANEOUS) IMPLANT
SNARE SHORT THROW 13M SML OVAL (MISCELLANEOUS) ×3 IMPLANT
SNARE SHORT THROW 30M LRG OVAL (MISCELLANEOUS) IMPLANT
SNARE SNG USE RND 15MM (INSTRUMENTS) IMPLANT
SPOT EX ENDOSCOPIC TATTOO (MISCELLANEOUS)
TRAP ETRAP POLY (MISCELLANEOUS) ×3 IMPLANT
VARIJECT INJECTOR VIN23 (MISCELLANEOUS) ×3
WATER STERILE IRR 250ML POUR (IV SOLUTION) ×3 IMPLANT

## 2016-09-05 NOTE — Telephone Encounter (Signed)
Pt would like a refill for glucose blood (ONE TOUCH ULTRA TEST) test strip sent to [facility] for 90 day supply to test twice a day.

## 2016-09-05 NOTE — H&P (Signed)
  Lucilla Lame, MD [facility] [address] [place], [place] [address] Phone: [phone] Fax : [phone]  Primary Care Physician:  Golden Pop, MD Primary Gastroenterologist:  Dr. Allen Norris  Pre-Procedure History & Physical: HPI:  Ralph Chapman is a 70 y.o. male is here for an colonoscopy.   Past Medical History:  Diagnosis Date  . Diabetes mellitus without complication ([place])   . Hyperlipidemia   . Hypertension     Past Surgical History:  Procedure Laterality Date  . APPENDECTOMY    . TONSILLECTOMY      Prior to Admission medications   Medication Sig Start Date End Date Taking? Authorizing Provider  benazepril (LOTENSIN) 40 MG tablet Take 1 tablet (40 mg total) by mouth daily. 08/01/16  Yes Guadalupe Maple, MD  Canagliflozin-Metformin HCl (INVOKAMET) [phone] MG TABS Take 1 tablet by mouth 2 (two) times daily. 08/01/16  Yes Guadalupe Maple, MD  GARCINIA CAMBOGIA-CHROMIUM PO Take by mouth.   Yes Historical Provider, MD  glucosamine-chondroitin (GLUCOSAMINE-CHONDROITIN DS) 500-400 MG tablet Take by mouth.   Yes Historical Provider, MD  glucose blood (ONE TOUCH ULTRA TEST) test strip USE TO CHECK BLOOD GLUCOSE TWICE DAILY 06/11/15  Yes Guadalupe Maple, MD  simvastatin (ZOCOR) 20 MG tablet Take 1 tablet (20 mg total) by mouth daily. 08/01/16  Yes Guadalupe Maple, MD    Allergies as of 08/11/2016 - Review Complete 08/11/2016  Allergen Reaction Noted  . Aspirin  06/10/2015  . Niacin and related Swelling 03/16/2016  . Tramadol Nausea And Vomiting 06/10/2015    Family History  Problem Relation Age of Onset  . Cancer Mother   . Heart attack Father 6  . Heart disease Brother     Social History   Social History  . Marital status: Married    Spouse name: N/A  . Number of children: N/A  . Years of education: N/A   Occupational History  . Not on file.   Social History Main Topics  . Smoking status: Never Smoker  . Smokeless tobacco: Never Used  . Alcohol use No    . Drug use: No  . Sexual activity: Not on file   Other Topics Concern  . Not on file   Social History Narrative  . No narrative on file    Review of Systems: See HPI, otherwise negative ROS  Physical Exam: BP 137/84   Pulse 70   Temp 97.8 F (36.6 C) (Temporal)   Resp 16   Ht 5\' 6"  (1.676 m)   Wt 215 lb (97.5 kg)   SpO2 99%   BMI 34.70 kg/m  General:   Alert,  pleasant and cooperative in NAD Head:  Normocephalic and atraumatic. Neck:  Supple; no masses or thyromegaly. Lungs:  Clear throughout to auscultation.    Heart:  Regular rate and rhythm. Abdomen:  Soft, nontender and nondistended. Normal bowel sounds, without guarding, and without rebound.   Neurologic:  Alert and  oriented x4;  grossly normal neurologically.  Impression/Plan: CALAN OTANEZ is here for an colonoscopy to be performed for history of coln polyps  Risks, benefits, limitations, and alternatives regarding  colonoscopy have been reviewed with the patient.  Questions have been answered.  All parties agreeable.   Lucilla Lame, MD  09/05/2016, 7:40 AM

## 2016-09-05 NOTE — Op Note (Signed)
[facility] Gastroenterology Patient Name: Ralph Chapman Procedure Date: 09/05/2016 7:25 AM MRN: [id number] Account #: [id number] Date of Birth: June 25, 1946 Admit Type: Outpatient Age: 70 Room: [facility] OR ROOM 01 Gender: Male Note Status: Finalized Procedure:            Colonoscopy Indications:          High risk colon cancer surveillance: Personal history                        of colonic polyps Providers:            Lucilla Lame MD, MD Referring MD:         Guadalupe Maple, MD (Referring MD) Medicines:            Propofol per Anesthesia Complications:        No immediate complications. Procedure:            Pre-Anesthesia Assessment:                       - Prior to the procedure, a History and Physical was                        performed, and patient medications and allergies were                        reviewed. The patient's tolerance of previous                        anesthesia was also reviewed. The risks and benefits of                        the procedure and the sedation options and risks were                        discussed with the patient. All questions were                        answered, and informed consent was obtained. Prior                        Anticoagulants: The patient has taken no previous                        anticoagulant or antiplatelet agents. ASA Grade                        Assessment: II - A patient with mild systemic disease.                        After reviewing the risks and benefits, the patient was                        deemed in satisfactory condition to undergo the                        procedure.                       After obtaining informed consent, the colonoscope was  passed under direct vision. Throughout the procedure,                        the patient's blood pressure, pulse, and oxygen                        saturations were monitored continuously. The Olympus CF   H180AL colonoscope (S#: [id number]) was introduced through                        the anus and advanced to the the cecum, identified by                        appendiceal orifice and ileocecal valve. The                        colonoscopy was performed without difficulty. The                        patient tolerated the procedure well. The quality of                        the bowel preparation was excellent. Findings:      The perianal and digital rectal examinations were normal.      Four sessile polyps were found in the cecum. The polyps were 3 to 4 mm       in size. These polyps were removed with a cold biopsy forceps. Resection       and retrieval were complete.      Two sessile polyps were found in the ascending colon. The polyps were 4       to 7 mm in size. These polyps were removed with a cold snare. Resection       and retrieval were complete.      A 12 mm polyp was found in the hepatic flexure. The polyp was sessile.       The polyp was removed with a hot snare. Resection and retrieval were       complete. To prevent bleeding post-intervention, one hemostatic clip was       successfully placed (MR conditional). There was no bleeding at the end       of the procedure.      A 5 mm polyp was found in the hepatic flexure. The polyp was sessile.       The polyp was removed with a cold snare. Resection and retrieval were       complete.      Three sessile polyps were found in the transverse colon. The polyps were       5 to 8 mm in size. These polyps were removed with a cold snare.       Resection and retrieval were complete.      A 15 mm polyp was found in the rectum. The polyp was sessile. The polyp       was removed with a hot snare. Resection and retrieval were complete. To       prevent bleeding post-intervention, three hemostatic clips were       successfully placed (MR conditional). There was no bleeding at the end       of the procedure.      A 15 mm polyp was found in the  transverse colon. The  polyp was sessile.       Area was successfully injected with saline for a lift polypectomy. To       prevent bleeding post-intervention, three hemostatic clips were       successfully placed (MR conditional). There was no bleeding at the end       of the procedure. Impression:           - Four 3 to 4 mm polyps in the cecum, removed with a                        cold biopsy forceps. Resected and retrieved.                       - Two 4 to 7 mm polyps in the ascending colon, removed                        with a cold snare. Resected and retrieved.                       - One 12 mm polyp at the hepatic flexure, removed with                        a hot snare. Resected and retrieved. Clip (MR                        conditional) was placed.                       - One 5 mm polyp at the hepatic flexure, removed with a                        cold snare. Resected and retrieved.                       - Three 5 to 8 mm polyps in the transverse colon,                        removed with a cold snare. Resected and retrieved.                       - One 15 mm polyp in the rectum, removed with a hot                        snare. Resected and retrieved. Clips (MR conditional)                        were placed.                       - One 15 mm polyp in the transverse colon. Injected.                        Clips (MR conditional) were placed. Recommendation:       - Discharge patient to home.                       - Resume previous diet.                       -  Continue present medications.                       - Await pathology results.                       - Repeat colonoscopy in 1 year for surveillance. Procedure Code(s):    --- Professional ---                       [phone], Colonoscopy, flexible; with removal of tumor(s),                        polyp(s), or other lesion(s) by snare technique                       45380, 15, Colonoscopy, flexible; with biopsy, single                         or multiple                       45381, Colonoscopy, flexible; with directed submucosal                        injection(s), any substance Diagnosis Code(s):    --- Professional ---                       Z86.010, Personal history of colonic polyps                       D12.0, Benign neoplasm of cecum                       D12.2, Benign neoplasm of ascending colon                       K62.1, Rectal polyp                       D12.3, Benign neoplasm of transverse colon (hepatic                        flexure or splenic flexure) CPT copyright 2016 American Medical Association. All rights reserved. The codes documented in this report are preliminary and upon coder review may  be revised to meet current compliance requirements. Lucilla Lame MD, MD 09/05/2016 8:39:40 AM This report has been signed electronically. Number of Addenda: 0 Note Initiated On: 09/05/2016 7:25 AM Scope Withdrawal Time: 0 hours 31 minutes 3 seconds  Total Procedure Duration: 0 hours 35 minutes 44 seconds       [facility]

## 2016-09-05 NOTE — Anesthesia Preprocedure Evaluation (Signed)
Anesthesia Evaluation  Patient identified by MRN, date of birth, ID band Patient awake    Reviewed: Allergy & Precautions, H&P , NPO status , Patient's Chart, lab work & pertinent test results, reviewed documented beta blocker date and time   Airway Mallampati: II  TM Distance: >3 FB Neck ROM: full    Dental no notable dental hx.    Pulmonary neg pulmonary ROS,    Pulmonary exam normal breath sounds clear to auscultation       Cardiovascular Exercise Tolerance: Good hypertension,  Rhythm:regular Rate:Normal     Neuro/Psych negative neurological ROS  negative psych ROS   GI/Hepatic negative GI ROS, Neg liver ROS,   Endo/Other  diabetes  Renal/GU negative Renal ROS  negative genitourinary   Musculoskeletal   Abdominal   Peds  Hematology negative hematology ROS (+)   Anesthesia Other Findings   Reproductive/Obstetrics negative OB ROS                             Anesthesia Physical Anesthesia Plan  ASA: II  Anesthesia Plan: MAC   Post-op Pain Management:    Induction:   Airway Management Planned:   Additional Equipment:   Intra-op Plan:   Post-operative Plan:   Informed Consent: I have reviewed the patients History and Physical, chart, labs and discussed the procedure including the risks, benefits and alternatives for the proposed anesthesia with the patient or authorized representative who has indicated his/her understanding and acceptance.   Dental Advisory Given  Plan Discussed with: CRNA  Anesthesia Plan Comments:         Anesthesia Quick Evaluation

## 2016-09-05 NOTE — Anesthesia Procedure Notes (Signed)
Procedure Name: MAC Date/Time: 09/05/2016 7:53 AM Performed by: Janna Arch Pre-anesthesia Checklist: Patient identified, Emergency Drugs available, Suction available and Patient being monitored Patient Re-evaluated:Patient Re-evaluated prior to inductionOxygen Delivery Method: Nasal cannula

## 2016-09-05 NOTE — Transfer of Care (Signed)
Immediate Anesthesia Transfer of Care Note  Patient: Ralph Chapman  Procedure(s) Performed: Procedure(s) with comments: COLONOSCOPY WITH PROPOFOL (N/A) - Diabetic - oral meds POLYPECTOMY  Patient Location: PACU  Anesthesia Type: MAC  Level of Consciousness: awake, alert  and patient cooperative  Airway and Oxygen Therapy: Patient Spontanous Breathing and Patient connected to supplemental oxygen  Post-op Assessment: Post-op Vital signs reviewed, Patient's Cardiovascular Status Stable, Respiratory Function Stable, Patent Airway and No signs of Nausea or vomiting  Post-op Vital Signs: Reviewed and stable  Complications: No apparent anesthesia complications

## 2016-09-05 NOTE — Telephone Encounter (Signed)
Order written, will get Dr.Crissman to sign and fax to [employer].

## 2016-09-05 NOTE — Anesthesia Postprocedure Evaluation (Signed)
Anesthesia Post Note  Patient: Ralph Chapman  Procedure(s) Performed: Procedure(s) (LRB): COLONOSCOPY WITH PROPOFOL (N/A) POLYPECTOMY  Patient location during evaluation: PACU Anesthesia Type: MAC Level of consciousness: awake and alert Pain management: pain level controlled Vital Signs Assessment: post-procedure vital signs reviewed and stable Respiratory status: spontaneous breathing, nonlabored ventilation, respiratory function stable and patient connected to nasal cannula oxygen Cardiovascular status: stable and blood pressure returned to baseline Anesthetic complications: no    Alisa Graff

## 2016-09-06 ENCOUNTER — Encounter: Payer: Self-pay | Admitting: Gastroenterology

## 2016-09-07 ENCOUNTER — Telehealth: Payer: Self-pay

## 2016-09-07 NOTE — Telephone Encounter (Signed)
Pt stated he came in for his CPE, they failed to do all the lab work at his CPE. He came in at a later date to have his lab work. Patient stated he has received bills from [place] requesting the copayment. Please call pt @ [phone]. Thanks.

## 2016-09-08 ENCOUNTER — Encounter: Payer: Self-pay | Admitting: Gastroenterology

## 2016-09-12 ENCOUNTER — Telehealth: Payer: Self-pay

## 2016-09-12 NOTE — Telephone Encounter (Signed)
-----   Message from Lucilla Lame, MD sent at 09/12/2016 10:56 AM EST ----- Patient is set up to see me tomorrow.

## 2016-09-12 NOTE — Telephone Encounter (Signed)
Pt scheduled for a follow up appt with Dr. Allen Norris for tomorrow, 09/13/16.

## 2016-09-13 ENCOUNTER — Encounter: Payer: Self-pay | Admitting: Gastroenterology

## 2016-09-13 ENCOUNTER — Encounter: Payer: Medicare Other | Admitting: General Surgery

## 2016-09-13 ENCOUNTER — Ambulatory Visit (INDEPENDENT_AMBULATORY_CARE_PROVIDER_SITE_OTHER): Payer: Medicare Other | Admitting: Gastroenterology

## 2016-09-13 VITALS — BP 138/73 | HR 86 | Temp 98.5°F | Ht 66.0 in | Wt 221.0 lb

## 2016-09-13 DIAGNOSIS — C20 Malignant neoplasm of rectum: Secondary | ICD-10-CM | POA: Diagnosis not present

## 2016-09-13 NOTE — Progress Notes (Signed)
   Primary Care Physician: Golden Pop, MD  Primary Gastroenterologist:  Dr. Lucilla Lame  Chief Complaint  Patient presents with  . Follow up colonoscopy results    HPI: Ralph Chapman is a 70 y.o. male here for follow-up after having a colonoscopy. The patient had numerous polyps at time of colonoscopy including 1 polyp that had high-grade dysplasia that was completely resected and a rectal polyp that showed submucosal invasion of adenocarcinoma. There are clips placed on the lesion in the rectum to prevent bleeding. The patient is now here for follow-up of the pathology results.  Current Outpatient Prescriptions  Medication Sig Dispense Refill  . benazepril (LOTENSIN) 40 MG tablet Take 1 tablet (40 mg total) by mouth daily. 90 tablet 4  . Canagliflozin-Metformin HCl (INVOKAMET) [phone] MG TABS Take 1 tablet by mouth 2 (two) times daily. 90 tablet 4  . GARCINIA CAMBOGIA-CHROMIUM PO Take by mouth.    Marland Kitchen glucosamine-chondroitin (GLUCOSAMINE-CHONDROITIN DS) 500-400 MG tablet Take by mouth.    Marland Kitchen glucose blood (ONE TOUCH ULTRA TEST) test strip USE TO CHECK BLOOD GLUCOSE TWICE DAILY 100 each 12  . simvastatin (ZOCOR) 20 MG tablet Take 1 tablet (20 mg total) by mouth daily. 90 tablet 4   No current facility-administered medications for this visit.     Allergies as of 09/13/2016 - Review Complete 09/13/2016  Allergen Reaction Noted  . Aspirin Hives, Shortness Of Breath, and Swelling 06/10/2015  . Niacin and related Swelling 03/16/2016  . Tramadol Nausea And Vomiting 06/10/2015    ROS:  General: Negative for anorexia, weight loss, fever, chills, fatigue, weakness. ENT: Negative for hoarseness, difficulty swallowing , nasal congestion. CV: Negative for chest pain, angina, palpitations, dyspnea on exertion, peripheral edema.  Respiratory: Negative for dyspnea at rest, dyspnea on exertion, cough, sputum, wheezing.  GI: See history of present illness. GU:  Negative for dysuria,  hematuria, urinary incontinence, urinary frequency, nocturnal urination.  Endo: Negative for unusual weight change.    Physical Examination:   BP 138/73   Pulse 86   Temp 98.5 F (36.9 C) (Oral)   Ht 5\' 6"  (1.676 m)   Wt 221 lb (100.2 kg)   BMI 35.67 kg/m   General: Well-nourished, well-developed in no acute distress.  Eyes: No icterus. Conjunctivae pink. Neuro: Alert and oriented x 3.  Grossly intact. Skin: Warm and dry, no jaundice.   Psych: Alert and cooperative, normal mood and affect.  Labs:    Imaging Studies: No results found.  Assessment and Plan:   Ralph Chapman is a 70 y.o. y/o male who was found to have adenocarcinoma with invasion of rectal polyp. I have had a long talk with the family including the patient's daughter who is a doctor at [facility]. The patient was set up to see a surgeon in my office today but the family has elected to see a Psychologist, sport and exercise at [facility]. The patient has been given copies of the report of his colonoscopy in addition to the copies of the pathology results. The patient will follow up at [facility] for further treatment of his rectal cancer and will have a repeat colonoscopy in one year.  25 minutes of the interaction was used to counsel the patient and his family.  Lucilla Lame, MD. Marval Regal   Note: This dictation was prepared with Dragon dictation along with smaller phrase technology. Any transcriptional errors that result from this process are unintentional.

## 2016-09-13 NOTE — Progress Notes (Signed)
Chart opened in error. Patient not seen.

## 2016-10-11 ENCOUNTER — Telehealth: Payer: Self-pay | Admitting: Family Medicine

## 2016-10-12 NOTE — Telephone Encounter (Signed)
Spoke with patient and advised that $20 copay was due to office visit billed by provider on same day of AWV.

## 2016-10-12 NOTE — Telephone Encounter (Signed)
Spoke with patient and advised that $20 copay for DOS 08/01/16 was billed for OV charged by Dr. Jeananne Rama on the same day as pt's AWV.

## 2016-11-01 ENCOUNTER — Telehealth: Payer: Self-pay | Admitting: Family Medicine

## 2016-11-01 NOTE — Telephone Encounter (Signed)
Call regarding billing issue.  See Account Notes

## 2016-11-08 ENCOUNTER — Ambulatory Visit (INDEPENDENT_AMBULATORY_CARE_PROVIDER_SITE_OTHER): Payer: Medicare Other | Admitting: Family Medicine

## 2016-11-08 ENCOUNTER — Encounter: Payer: Self-pay | Admitting: Family Medicine

## 2016-11-08 VITALS — BP 115/80 | HR 71 | Temp 98.2°F | Ht 67.0 in | Wt 226.0 lb

## 2016-11-08 DIAGNOSIS — I10 Essential (primary) hypertension: Secondary | ICD-10-CM

## 2016-11-08 DIAGNOSIS — E119 Type 2 diabetes mellitus without complications: Secondary | ICD-10-CM

## 2016-11-08 DIAGNOSIS — Z85038 Personal history of other malignant neoplasm of large intestine: Secondary | ICD-10-CM | POA: Insufficient documentation

## 2016-11-08 DIAGNOSIS — C187 Malignant neoplasm of sigmoid colon: Secondary | ICD-10-CM

## 2016-11-08 DIAGNOSIS — E7800 Pure hypercholesterolemia, unspecified: Secondary | ICD-10-CM

## 2016-11-08 DIAGNOSIS — C189 Malignant neoplasm of colon, unspecified: Secondary | ICD-10-CM | POA: Insufficient documentation

## 2016-11-08 LAB — BAYER DCA HB A1C WAIVED: HB A1C: 6.8 % (ref <7.0)

## 2016-11-08 MED ORDER — METFORMIN HCL 1000 MG PO TABS: 1000.0000 mg | ORAL_TABLET | Freq: Two times a day (BID) | ORAL | 3 refills | Status: DC

## 2016-11-08 NOTE — Assessment & Plan Note (Addendum)
We will stop Invokanna observe for A1c patient will do better with diet exercise nutrition weight loss. Changed to metformin 1000 twice a day only.

## 2016-11-08 NOTE — Assessment & Plan Note (Signed)
The current medical regimen is effective;  continue present plan and medications.  

## 2016-11-08 NOTE — Progress Notes (Signed)
BP 115/80   Pulse 71   Temp 98.2 F (36.8 C) (Oral)   Ht 5\' 7"  (1.702 m)   Wt 226 lb (102.5 kg)   SpO2 97%   BMI 35.40 kg/m    Subjective:    Patient ID: Ralph Chapman, male    DOB: 1946-03-01, 71 y.o.   MRN: [id number]  HPI: Ralph Chapman is a 71 y.o. male  Chief Complaint  Patient presents with  . Follow-up  . Diabetes   Pt with surgery for colon cancer had cancerous polyp which was removed patient doing well. Concerned about toe cramps and Invokanna. Wants to stop that observe blood sugars and A1c control with just metformin. Patient trying to do better with diet exercise weight loss  Relevant past medical, surgical, family and social history reviewed and updated as indicated. Interim medical history since our last visit reviewed. Allergies and medications reviewed and updated.  Review of Systems  Constitutional: Negative.   Respiratory: Negative.   Cardiovascular: Negative.     Per HPI unless specifically indicated above     Objective:    BP 115/80   Pulse 71   Temp 98.2 F (36.8 C) (Oral)   Ht 5\' 7"  (1.702 m)   Wt 226 lb (102.5 kg)   SpO2 97%   BMI 35.40 kg/m   Wt Readings from Last 3 Encounters:  11/08/16 226 lb (102.5 kg)  09/13/16 221 lb (100.2 kg)  09/05/16 215 lb (97.5 kg)    Physical Exam  Constitutional: He is oriented to person, place, and time. He appears well-developed and well-nourished. No distress.  HENT:  Head: Normocephalic and atraumatic.  Right Ear: Hearing normal.  Left Ear: Hearing normal.  Nose: Nose normal.  Eyes: Conjunctivae and lids are normal. Right eye exhibits no discharge. Left eye exhibits no discharge. No scleral icterus.  Cardiovascular: Normal rate, regular rhythm and normal heart sounds.   Pulmonary/Chest: Effort normal and breath sounds normal. No respiratory distress.  Musculoskeletal: Normal range of motion.  Neurological: He is alert and oriented to person, place, and time.  Skin: Skin is intact. No rash  noted.  Psychiatric: He has a normal mood and affect. His speech is normal and behavior is normal. Judgment and thought content normal. Cognition and memory are normal.    Results for orders placed or performed during the hospital encounter of 09/05/16  Glucose, capillary  Result Value Ref Range   Glucose-Capillary 112 (H) 65 - 99 mg/dL  Glucose, capillary  Result Value Ref Range   Glucose-Capillary 95 65 - 99 mg/dL      Assessment & Plan:   Problem List Items Addressed This Visit      Cardiovascular and Mediastinum   Hypertension    The current medical regimen is effective;  continue present plan and medications.       Relevant Orders   Bayer DCA Hb A1c Waived (STAT)     Digestive   Colon cancer [facility])     Endocrine   Diabetes mellitus without complication ([place]) - Primary    We will stop Invokanna observe for A1c patient will do better with diet exercise nutrition weight loss. Changed to metformin 1000 twice a day only.       Relevant Medications   metFORMIN (GLUCOPHAGE) 1000 MG tablet   Other Relevant Orders   Bayer DCA Hb A1c Waived (STAT)     Other   Hyperlipidemia   Relevant Orders   Bayer DCA Hb A1c Waived (STAT)  Follow up plan: Return in about 3 months (around 02/06/2017) for Hemoglobin A1c, BMP,  Lipids, ALT, AST.

## 2017-01-20 ENCOUNTER — Telehealth: Payer: Self-pay | Admitting: Family Medicine

## 2017-01-20 NOTE — Telephone Encounter (Signed)
Patient has been under the care of [facility] for his knee.  Per patient they have given him a script for Dexamethasone but they said he would need his PCP to sign this script in order to get it filled.  I explained that Dr Jeananne Rama is away this week and will not be back until next week but I would send the message to the assistant.  He is requesting someone call him to discuss.  Thanks  [phone]

## 2017-01-20 NOTE — Telephone Encounter (Signed)
Please advise 

## 2017-01-22 NOTE — Telephone Encounter (Signed)
Call pt 

## 2017-01-23 NOTE — Telephone Encounter (Signed)
Patient returned Jadas call.  He would like to speak with her regarding his request for the script.  thanks

## 2017-01-23 NOTE — Telephone Encounter (Signed)
Left message on machine for pt to return call to the office.  

## 2017-01-23 NOTE — Telephone Encounter (Signed)
Patient on exercise bike earlier and twisted knee somehow with MRI was referred to PT which has recommended a steroid cream. Dexamethasone 30 mg cream

## 2017-02-08 ENCOUNTER — Ambulatory Visit (INDEPENDENT_AMBULATORY_CARE_PROVIDER_SITE_OTHER): Payer: Medicare Other | Admitting: Family Medicine

## 2017-02-08 ENCOUNTER — Encounter: Payer: Self-pay | Admitting: Family Medicine

## 2017-02-08 VITALS — BP 116/77 | HR 79 | Ht 65.55 in | Wt 209.0 lb

## 2017-02-08 DIAGNOSIS — E7800 Pure hypercholesterolemia, unspecified: Secondary | ICD-10-CM | POA: Diagnosis not present

## 2017-02-08 DIAGNOSIS — E119 Type 2 diabetes mellitus without complications: Secondary | ICD-10-CM | POA: Diagnosis not present

## 2017-02-08 DIAGNOSIS — I10 Essential (primary) hypertension: Secondary | ICD-10-CM

## 2017-02-08 DIAGNOSIS — G4700 Insomnia, unspecified: Secondary | ICD-10-CM

## 2017-02-08 LAB — LP+ALT+AST PICCOLO, WAIVED
ALT (SGPT) Piccolo, Waived: 27 U/L (ref 10–47)
AST (SGOT) PICCOLO, WAIVED: 22 U/L (ref 11–38)
CHOLESTEROL PICCOLO, WAIVED: 134 mg/dL (ref <200)
Chol/HDL Ratio Piccolo,Waive: 3.1 mg/dL
HDL Chol Piccolo, Waived: 42 mg/dL — ABNORMAL LOW (ref >59)
LDL Chol Calc Piccolo Waived: 68 mg/dL (ref <100)
Triglycerides Piccolo,Waived: 114 mg/dL (ref <150)
VLDL Chol Calc Piccolo,Waive: 23 mg/dL (ref <30)

## 2017-02-08 LAB — BAYER DCA HB A1C WAIVED: HB A1C: 6.8 % (ref <7.0)

## 2017-02-08 MED ORDER — GLUCOSE BLOOD VI STRP: ORAL_STRIP | 12 refills | Status: DC

## 2017-02-08 MED ORDER — TRAZODONE HCL 50 MG PO TABS: 25.0000 mg | ORAL_TABLET | Freq: Every evening | ORAL | 3 refills | Status: DC | PRN

## 2017-02-08 NOTE — Assessment & Plan Note (Signed)
Discuss insomnia with patient and various available treatments will avoid controlled drugs. We will start with trazodone and observe response.

## 2017-02-08 NOTE — Progress Notes (Signed)
BP 116/77   Pulse 79   Ht 5' 5.55" (1.665 m)   Wt 209 lb (94.8 kg)   SpO2 99%   BMI 34.20 kg/m    Subjective:    Patient ID: Ralph Chapman, male    DOB: 12/27/1945, 71 y.o.   MRN: [id number]  HPI: Ralph Chapman is a 71 y.o. male  Chief Complaint  Patient presents with  . Follow-up  . Diabetes  . Knee Pain  . Insomnia  Patient's primary concern today is insomnia. This been long-term and ongoing for years but getting worse over the last several months. Patient has tried Tylenol PM and melatonin over the last several years 7 months without relief. Patient has both trouble falling asleep and then once finally sleep wakes up in our hour and a half and really can go back to sleep. Sleeps only about 3 hours at night. Pretty much every night. Doesn't really complain of daytime drowsiness does not have trouble driving and sleepiness. Does have problems staying awake in church.   Been exacerbated in the last several weeks by taking simvastatin and developing myalgias in retrospect patient had myalgias from simvastatin previously is stop simvastatin and myalgias have gotten better on within 2 nights.  Patient's blood sugars been doing well except when took cortisone shot for his knee which is doing better.  Blood pressure also doing okay  Relevant past medical, surgical, family and social history reviewed and updated as indicated. Interim medical history since our last visit reviewed. Allergies and medications reviewed and updated.  Review of Systems  Constitutional: Negative.   Respiratory: Negative.   Cardiovascular: Negative.     Per HPI unless specifically indicated above     Objective:    BP 116/77   Pulse 79   Ht 5' 5.55" (1.665 m)   Wt 209 lb (94.8 kg)   SpO2 99%   BMI 34.20 kg/m   Wt Readings from Last 3 Encounters:  02/08/17 209 lb (94.8 kg)  11/08/16 226 lb (102.5 kg)  09/13/16 221 lb (100.2 kg)    Physical Exam  Constitutional: He is oriented to person,  place, and time. He appears well-developed and well-nourished.  HENT:  Head: Normocephalic and atraumatic.  Eyes: Conjunctivae and EOM are normal.  Neck: Normal range of motion.  Cardiovascular: Normal rate, regular rhythm and normal heart sounds.   Pulmonary/Chest: Effort normal and breath sounds normal.  Musculoskeletal: Normal range of motion.  Neurological: He is alert and oriented to person, place, and time.  Skin: No erythema.  Psychiatric: He has a normal mood and affect. His behavior is normal. Judgment and thought content normal.    Results for orders placed or performed in visit on 11/08/16  Bayer DCA Hb A1c Waived (STAT)  Result Value Ref Range   Bayer DCA Hb A1c Waived 6.8 <7.0 %      Assessment & Plan:   Problem List Items Addressed This Visit      Cardiovascular and Mediastinum   Hypertension - Primary    The current medical regimen is effective;  continue present plan and medications.       Relevant Orders   Bayer DCA Hb A1c Waived   LP+ALT+AST Piccolo, Waived   Basic metabolic panel     Endocrine   Diabetes mellitus without complication ([place])    Diabetes remains good control with less medication and with great weight loss patient will continue current care and treatment.      Relevant Medications  glucose blood (ONE TOUCH ULTRA TEST) test strip   Other Relevant Orders   Bayer DCA Hb A1c Waived   LP+ALT+AST Piccolo, Waived   Basic metabolic panel     Other   Hyperlipidemia    For cholesterol will stop simvastatin and observe cholesterol response on the next visit in 3 months for assessing control with dietary measures.      Relevant Orders   Bayer DCA Hb A1c Waived   LP+ALT+AST Piccolo, Waived   Basic metabolic panel   Insomnia    Discuss insomnia with patient and various available treatments will avoid controlled drugs. We will start with trazodone and observe response.          Follow up plan: Return in about 3 months (around 05/11/2017)  for Hemoglobin A1c,  Lipids, ALT, AST.

## 2017-02-08 NOTE — Assessment & Plan Note (Signed)
For cholesterol will stop simvastatin and observe cholesterol response on the next visit in 3 months for assessing control with dietary measures.

## 2017-02-08 NOTE — Assessment & Plan Note (Signed)
The current medical regimen is effective;  continue present plan and medications.  

## 2017-02-08 NOTE — Assessment & Plan Note (Signed)
Diabetes remains good control with less medication and with great weight loss patient will continue current care and treatment.

## 2017-02-09 ENCOUNTER — Encounter: Payer: Self-pay | Admitting: Family Medicine

## 2017-02-09 LAB — BASIC METABOLIC PANEL
BUN / CREAT RATIO: 22 (ref 10–24)
BUN: 19 mg/dL (ref 8–27)
CO2: 21 mmol/L (ref 18–29)
CREATININE: 0.88 mg/dL (ref 0.76–1.27)
Calcium: 9.4 mg/dL (ref 8.6–10.2)
Chloride: 100 mmol/L (ref 96–106)
GFR calc Af Amer: 101 mL/min/{1.73_m2} (ref >59)
GFR calc non Af Amer: 88 mL/min/{1.73_m2} (ref >59)
GLUCOSE: 127 mg/dL — AB (ref 65–99)
Potassium: 5 mmol/L (ref 3.5–5.2)
Sodium: 139 mmol/L (ref 134–144)

## 2017-05-23 ENCOUNTER — Encounter: Payer: Self-pay | Admitting: Family Medicine

## 2017-05-23 ENCOUNTER — Ambulatory Visit (INDEPENDENT_AMBULATORY_CARE_PROVIDER_SITE_OTHER): Payer: Medicare Other | Admitting: Family Medicine

## 2017-05-23 ENCOUNTER — Telehealth: Payer: Self-pay | Admitting: Family Medicine

## 2017-05-23 VITALS — BP 134/84 | HR 67 | Resp 16 | Ht 65.0 in | Wt 200.0 lb

## 2017-05-23 DIAGNOSIS — G4700 Insomnia, unspecified: Secondary | ICD-10-CM | POA: Diagnosis not present

## 2017-05-23 DIAGNOSIS — I10 Essential (primary) hypertension: Secondary | ICD-10-CM | POA: Diagnosis not present

## 2017-05-23 DIAGNOSIS — E7800 Pure hypercholesterolemia, unspecified: Secondary | ICD-10-CM

## 2017-05-23 DIAGNOSIS — E119 Type 2 diabetes mellitus without complications: Secondary | ICD-10-CM | POA: Diagnosis not present

## 2017-05-23 LAB — LP+ALT+AST PICCOLO, WAIVED
ALT (SGPT) Piccolo, Waived: 17 U/L (ref 10–47)
AST (SGOT) PICCOLO, WAIVED: 27 U/L (ref 11–38)
CHOLESTEROL PICCOLO, WAIVED: 202 mg/dL — AB (ref <200)
Chol/HDL Ratio Piccolo,Waive: 4.5 mg/dL
HDL Chol Piccolo, Waived: 45 mg/dL — ABNORMAL LOW (ref >59)
LDL CHOL CALC PICCOLO WAIVED: 136 mg/dL — AB (ref <100)
Triglycerides Piccolo,Waived: 108 mg/dL (ref <150)
VLDL Chol Calc Piccolo,Waive: 22 mg/dL (ref <30)

## 2017-05-23 LAB — BAYER DCA HB A1C WAIVED: HB A1C: 5.5 % (ref <7.0)

## 2017-05-23 MED ORDER — SIMVASTATIN 20 MG PO TABS: 20.0000 mg | ORAL_TABLET | Freq: Every day | ORAL | 1 refills | Status: DC

## 2017-05-23 NOTE — Assessment & Plan Note (Signed)
For diabetes doing very well noted low blood sugar spells discussed decreasing metformin 500 mg either morning or evening may be decreasing by 500 mg morning and evening will observe blood glucose and assess.

## 2017-05-23 NOTE — Assessment & Plan Note (Signed)
Discussed cholesterol back up off medications will restart simvastatin but instead of at 40 mg we'll start at 20 mg as patient's lost some much weight and doing better.

## 2017-05-23 NOTE — Assessment & Plan Note (Signed)
The current medical regimen is effective;  continue present plan and medications.  

## 2017-05-23 NOTE — Telephone Encounter (Signed)
Patient would like for [place] to take off Ralph Chapman and only have [place] [facility] as his Pharmacy.  Thank you

## 2017-05-23 NOTE — Progress Notes (Signed)
BP 134/84   Pulse 67   Resp 16   Ht 5\' 5"  (1.651 m)   Wt 200 lb (90.7 kg)   BMI 33.28 kg/m    Subjective:    Patient ID: Ralph Chapman, male    DOB: Sep 22, 1946, 71 y.o.   MRN: [id number]  HPI: Ralph Chapman is a 71 y.o. male  Chief Complaint  Patient presents with  . Diabetes    BS ranges [phone] checking twice daily. Last A1C 6.8 in May.   . Hypertension   Patient all in all doing very well. Has been exceptionally active at work and on losing weight and is lost 26 pounds this year. As a consequence diabetes is much better no low blood sugar spells and home glucose monitoring indicating good control. Myalgias in his thighs was unresponsive to stopping simvastatin but is just kind of gradually gone away. And otherwise feels fine. Is wondering if myalgias were related to IV contrast media that he had with imaging study sometime back. In any case this is resolved. Insomnia has also resolved not taking any medications. Blood pressure also doing well with no issues.  Relevant past medical, surgical, family and social history reviewed and updated as indicated. Interim medical history since our last visit reviewed. Allergies and medications reviewed and updated.  Review of Systems  Constitutional: Negative.   Respiratory: Negative.   Cardiovascular: Negative.     Per HPI unless specifically indicated above     Objective:    BP 134/84   Pulse 67   Resp 16   Ht 5\' 5"  (1.651 m)   Wt 200 lb (90.7 kg)   BMI 33.28 kg/m   Wt Readings from Last 3 Encounters:  05/23/17 200 lb (90.7 kg)  02/08/17 209 lb (94.8 kg)  11/08/16 226 lb (102.5 kg)    Physical Exam  Constitutional: He is oriented to person, place, and time. He appears well-developed and well-nourished.  HENT:  Head: Normocephalic and atraumatic.  Eyes: Conjunctivae and EOM are normal.  Neck: Normal range of motion.  Cardiovascular: Normal rate, regular rhythm and normal heart sounds.   Pulmonary/Chest:  Effort normal and breath sounds normal.  Musculoskeletal: Normal range of motion.  Neurological: He is alert and oriented to person, place, and time.  Skin: No erythema.  Psychiatric: He has a normal mood and affect. His behavior is normal. Judgment and thought content normal.    Results for orders placed or performed in visit on 02/08/17  Bayer DCA Hb A1c Waived  Result Value Ref Range   Bayer DCA Hb A1c Waived 6.8 <7.0 %  LP+ALT+AST Piccolo, Waived  Result Value Ref Range   ALT (SGPT) Piccolo, Waived 27 10 - 47 U/L   AST (SGOT) Piccolo, Waived 22 11 - 38 U/L   Cholesterol Piccolo, Waived 134 <200 mg/dL   HDL Chol Piccolo, Waived 42 (L) >59 mg/dL   Triglycerides Piccolo,Waived 114 <150 mg/dL   Chol/HDL Ratio Piccolo,Waive 3.1 mg/dL   LDL Chol Calc Piccolo Waived 68 <100 mg/dL   VLDL Chol Calc Piccolo,Waive 23 <30 mg/dL  Basic metabolic panel  Result Value Ref Range   Glucose 127 (H) 65 - 99 mg/dL   BUN 19 8 - 27 mg/dL   Creatinine, Ser 0.88 0.76 - 1.27 mg/dL   GFR calc non Af Amer 88 >59 mL/min/1.73   GFR calc Af Amer 101 >59 mL/min/1.73   BUN/Creatinine Ratio 22 10 - 24   Sodium 139 134 - 144 mmol/L  Potassium 5.0 3.5 - 5.2 mmol/L   Chloride 100 96 - 106 mmol/L   CO2 21 18 - 29 mmol/L   Calcium 9.4 8.6 - 10.2 mg/dL      Assessment & Plan:   Problem List Items Addressed This Visit      Cardiovascular and Mediastinum   Hypertension - Primary    The current medical regimen is effective;  continue present plan and medications.       Relevant Medications   simvastatin (ZOCOR) 20 MG tablet   Other Relevant Orders   Bayer DCA Hb A1c Waived   LP+ALT+AST Piccolo, Waived     Endocrine   Diabetes mellitus without complication ([place])    For diabetes doing very well noted low blood sugar spells discussed decreasing metformin 500 mg either morning or evening may be decreasing by 500 mg morning and evening will observe blood glucose and assess.      Relevant Medications    simvastatin (ZOCOR) 20 MG tablet   Other Relevant Orders   Bayer DCA Hb A1c Waived   LP+ALT+AST Piccolo, Waived     Other   Hyperlipidemia    Discussed cholesterol back up off medications will restart simvastatin but instead of at 40 mg we'll start at 20 mg as patient's lost some much weight and doing better.      Relevant Medications   simvastatin (ZOCOR) 20 MG tablet   Other Relevant Orders   Bayer DCA Hb A1c Waived   LP+ALT+AST Piccolo, Waived   Insomnia    No further problems          Follow up plan: Return in about 3 months (around 08/23/2017) for Physical Exam, Hemoglobin A1c.

## 2017-05-23 NOTE — Telephone Encounter (Signed)
[facility] removed off of patient's chart per his request. Dr. Jeananne Rama, can we please resend the patient's simvastatin to [facility] for him?

## 2017-05-23 NOTE — Assessment & Plan Note (Signed)
No further problems. 

## 2017-09-01 ENCOUNTER — Ambulatory Visit (INDEPENDENT_AMBULATORY_CARE_PROVIDER_SITE_OTHER): Payer: Medicare Other

## 2017-09-01 VITALS — BP 124/68 | HR 78 | Temp 98.7°F | Resp 16 | Ht 67.0 in | Wt 212.5 lb

## 2017-09-01 DIAGNOSIS — R5383 Other fatigue: Secondary | ICD-10-CM | POA: Diagnosis not present

## 2017-09-01 DIAGNOSIS — N400 Enlarged prostate without lower urinary tract symptoms: Secondary | ICD-10-CM

## 2017-09-01 DIAGNOSIS — I10 Essential (primary) hypertension: Secondary | ICD-10-CM | POA: Diagnosis not present

## 2017-09-01 DIAGNOSIS — E785 Hyperlipidemia, unspecified: Secondary | ICD-10-CM

## 2017-09-01 DIAGNOSIS — Z23 Encounter for immunization: Secondary | ICD-10-CM

## 2017-09-01 DIAGNOSIS — E119 Type 2 diabetes mellitus without complications: Secondary | ICD-10-CM | POA: Diagnosis not present

## 2017-09-01 DIAGNOSIS — Z0000 Encounter for general adult medical examination without abnormal findings: Secondary | ICD-10-CM | POA: Diagnosis not present

## 2017-09-01 LAB — URINALYSIS, ROUTINE W REFLEX MICROSCOPIC
Bilirubin, UA: NEGATIVE
GLUCOSE, UA: NEGATIVE
KETONES UA: NEGATIVE
NITRITE UA: NEGATIVE
Protein, UA: NEGATIVE
RBC UA: NEGATIVE
SPEC GRAV UA: 1.02 (ref 1.005–1.030)
UUROB: 0.2 mg/dL (ref 0.2–1.0)
pH, UA: 5 (ref 5.0–7.5)

## 2017-09-01 LAB — MICROSCOPIC EXAMINATION
Bacteria, UA: NONE SEEN
RBC, UA: NONE SEEN /hpf (ref 0–?)

## 2017-09-01 LAB — BAYER DCA HB A1C WAIVED: HB A1C: 6.6 % (ref <7.0)

## 2017-09-01 NOTE — Patient Instructions (Addendum)
[name] , Thank you for taking time to come for your Medicare Wellness Visit. I appreciate your ongoing commitment to your health goals. Please review the following plan we discussed and let me know if I can assist you in the future.   Screening recommendations/referrals: Colonoscopy:completed 09/05/2016 Recommended yearly ophthalmology/optometry visit for glaucoma screening and checkup Recommended yearly dental visit for hygiene and checkup  Vaccinations: Influenza vaccine: done today Pneumococcal vaccine: up to date Tdap vaccine: up to date Shingles vaccine: up to date   Advanced directives: Advance directive discussed with you today. I have provided a copy for you to complete at home and have notarized. Once this is complete please bring a copy in to our office so we can scan it into your chart.  Conditions/risks identified: Recommend drinking at least 5-6 glasses of water a day   Next appointment: Follow up on 09/05/2017 at 1:00pm with Dr.Crissman. Follow up in one year for your annual wellness exam.   Preventive Care 69 Years and Older, Male Preventive care refers to lifestyle choices and visits with your health care provider that can promote health and wellness. What does preventive care include?  A yearly physical exam. This is also called an annual well check.  Dental exams once or twice a year.  Routine eye exams. Ask your health care provider how often you should have your eyes checked.  Personal lifestyle choices, including:  Daily care of your teeth and gums.  Regular physical activity.  Eating a healthy diet.  Avoiding tobacco and drug use.  Limiting alcohol use.  Practicing safe sex.  Taking low doses of aspirin every day.  Taking vitamin and mineral supplements as recommended by your health care provider. What happens during an annual well check? The services and screenings done by your health care provider during your annual well check will depend  on your age, overall health, lifestyle risk factors, and family history of disease. Counseling  Your health care provider may ask you questions about your:  Alcohol use.  Tobacco use.  Drug use.  Emotional well-being.  Home and relationship well-being.  Sexual activity.  Eating habits.  History of falls.  Memory and ability to understand (cognition).  Work and work Statistician. Screening  You may have the following tests or measurements:  Height, weight, and BMI.  Blood pressure.  Lipid and cholesterol levels. These may be checked every 5 years, or more frequently if you are over 69 years old.  Skin check.  Lung cancer screening. You may have this screening every year starting at age 69 if you have a 30-pack-year history of smoking and currently smoke or have quit within the past 15 years.  Fecal occult blood test (FOBT) of the stool. You may have this test every year starting at age 69.  Flexible sigmoidoscopy or colonoscopy. You may have a sigmoidoscopy every 5 years or a colonoscopy every 10 years starting at age 69.  Prostate cancer screening. Recommendations will vary depending on your family history and other risks.  Hepatitis C blood test.  Hepatitis B blood test.  Sexually transmitted disease (STD) testing.  Diabetes screening. This is done by checking your blood sugar (glucose) after you have not eaten for a while (fasting). You may have this done every 1-3 years.  Abdominal aortic aneurysm (AAA) screening. You may need this if you are a current or former smoker.  Osteoporosis. You may be screened starting at age 69 if you are at high risk. Talk with your  health care provider about your test results, treatment options, and if necessary, the need for more tests. Vaccines  Your health care provider may recommend certain vaccines, such as:  Influenza vaccine. This is recommended every year.  Tetanus, diphtheria, and acellular pertussis (Tdap, Td)  vaccine. You may need a Td booster every 10 years.  Zoster vaccine. You may need this after age 10.  Pneumococcal 13-valent conjugate (PCV13) vaccine. One dose is recommended after age 69.  Pneumococcal polysaccharide (PPSV23) vaccine. One dose is recommended after age 69. Talk to your health care provider about which screenings and vaccines you need and how often you need them. This information is not intended to replace advice given to you by your health care provider. Make sure you discuss any questions you have with your health care provider. Document Released: 10/23/2015 Document Revised: 06/15/2016 Document Reviewed: 07/28/2015 Elsevier Interactive Patient Education  2017 [place] Prevention in the Home Falls can cause injuries. They can happen to people of all ages. There are many things you can do to make your home safe and to help prevent falls. What can I do on the outside of my home?  Regularly fix the edges of walkways and driveways and fix any cracks.  Remove anything that might make you trip as you walk through a door, such as a raised step or threshold.  Trim any bushes or trees on the path to your home.  Use bright outdoor lighting.  Clear any walking paths of anything that might make someone trip, such as rocks or tools.  Regularly check to see if handrails are loose or broken. Make sure that both sides of any steps have handrails.  Any raised decks and porches should have guardrails on the edges.  Have any leaves, snow, or ice cleared regularly.  Use sand or salt on walking paths during winter.  Clean up any spills in your garage right away. This includes oil or grease spills. What can I do in the bathroom?  Use night lights.  Install grab bars by the toilet and in the tub and shower. Do not use towel bars as grab bars.  Use non-skid mats or decals in the tub or shower.  If you need to sit down in the shower, use a plastic, non-slip  stool.  Keep the floor dry. Clean up any water that spills on the floor as soon as it happens.  Remove soap buildup in the tub or shower regularly.  Attach bath mats securely with double-sided non-slip rug tape.  Do not have throw rugs and other things on the floor that can make you trip. What can I do in the bedroom?  Use night lights.  Make sure that you have a light by your bed that is easy to reach.  Do not use any sheets or blankets that are too big for your bed. They should not hang down onto the floor.  Have a firm chair that has side arms. You can use this for support while you get dressed.  Do not have throw rugs and other things on the floor that can make you trip. What can I do in the kitchen?  Clean up any spills right away.  Avoid walking on wet floors.  Keep items that you use a lot in easy-to-reach places.  If you need to reach something above you, use a strong step stool that has a grab bar.  Keep electrical cords out of the way.  Do not use  floor polish or wax that makes floors slippery. If you must use wax, use non-skid floor wax.  Do not have throw rugs and other things on the floor that can make you trip. What can I do with my stairs?  Do not leave any items on the stairs.  Make sure that there are handrails on both sides of the stairs and use them. Fix handrails that are broken or loose. Make sure that handrails are as long as the stairways.  Check any carpeting to make sure that it is firmly attached to the stairs. Fix any carpet that is loose or worn.  Avoid having throw rugs at the top or bottom of the stairs. If you do have throw rugs, attach them to the floor with carpet tape.  Make sure that you have a light switch at the top of the stairs and the bottom of the stairs. If you do not have them, ask someone to add them for you. What else can I do to help prevent falls?  Wear shoes that:  Do not have high heels.  Have rubber bottoms.  Are  comfortable and fit you well.  Are closed at the toe. Do not wear sandals.  If you use a stepladder:  Make sure that it is fully opened. Do not climb a closed stepladder.  Make sure that both sides of the stepladder are locked into place.  Ask someone to hold it for you, if possible.  Clearly mark and make sure that you can see:  Any grab bars or handrails.  First and last steps.  Where the edge of each step is.  Use tools that help you move around (mobility aids) if they are needed. These include:  Canes.  Walkers.  Scooters.  Crutches.  Turn on the lights when you go into a dark area. Replace any light bulbs as soon as they burn out.  Set up your furniture so you have a clear path. Avoid moving your furniture around.  If any of your floors are uneven, fix them.  If there are any pets around you, be aware of where they are.  Review your medicines with your doctor. Some medicines can make you feel dizzy. This can increase your chance of falling. Ask your doctor what other things that you can do to help prevent falls. This information is not intended to replace advice given to you by your health care provider. Make sure you discuss any questions you have with your health care provider. Document Released: 07/23/2009 Document Revised: 03/03/2016 Document Reviewed: 10/31/2014 Elsevier Interactive Patient Education  2017 [employer].   Influenza (Flu) Vaccine (Inactivated or Recombinant): What You Need to Know 1. Why get vaccinated? Influenza ("flu") is a contagious disease that spreads around the [place] every year, usually between October and May. Flu is caused by influenza viruses, and is spread mainly by coughing, sneezing, and close contact. Anyone can get flu. Flu strikes suddenly and can last several days. Symptoms vary by age, but can include:  fever/chills  sore throat  muscle aches  fatigue  cough  headache  runny or stuffy nose  Flu can  also lead to pneumonia and blood infections, and cause diarrhea and seizures in children. If you have a medical condition, such as heart or lung disease, flu can make it worse. Flu is more dangerous for some people. Infants and young children, people 64 years of age and older, pregnant women, and people with certain health conditions or a  weakened immune system are at greatest risk. Each year thousands of people in the [place] die from flu, and many more are hospitalized. Flu vaccine can:  keep you from getting flu,  make flu less severe if you do get it, and  keep you from spreading flu to your family and other people. 2. Inactivated and recombinant flu vaccines A dose of flu vaccine is recommended every flu season. Children 6 months through 10 years of age may need two doses during the same flu season. Everyone else needs only one dose each flu season. Some inactivated flu vaccines contain a very small amount of a mercury-based preservative called thimerosal. Studies have not shown thimerosal in vaccines to be harmful, but flu vaccines that do not contain thimerosal are available. There is no live flu virus in flu shots. They cannot cause the flu. There are many flu viruses, and they are always changing. Each year a new flu vaccine is made to protect against three or four viruses that are likely to cause disease in the upcoming flu season. But even when the vaccine doesn't exactly match these viruses, it may still provide some protection. Flu vaccine cannot prevent:  flu that is caused by a virus not covered by the vaccine, or  illnesses that look like flu but are not.  It takes about 2 weeks for protection to develop after vaccination, and protection lasts through the flu season. 3. Some people should not get this vaccine Tell the person who is giving you the vaccine:  If you have any severe, life-threatening allergies. If you ever had a life-threatening allergic reaction after a  dose of flu vaccine, or have a severe allergy to any part of this vaccine, you may be advised not to get vaccinated. Most, but not all, types of flu vaccine contain a small amount of egg protein.  If you ever had Guillain-Barr Syndrome (also called GBS). Some people with a history of GBS should not get this vaccine. This should be discussed with your doctor.  If you are not feeling well. It is usually okay to get flu vaccine when you have a mild illness, but you might be asked to come back when you feel better.  4. Risks of a vaccine reaction With any medicine, including vaccines, there is a chance of reactions. These are usually mild and go away on their own, but serious reactions are also possible. Most people who get a flu shot do not have any problems with it. Minor problems following a flu shot include:  soreness, redness, or swelling where the shot was given  hoarseness  sore, red or itchy eyes  cough  fever  aches  headache  itching  fatigue  If these problems occur, they usually begin soon after the shot and last 1 or 2 days. More serious problems following a flu shot can include the following:  There may be a small increased risk of Guillain-Barre Syndrome (GBS) after inactivated flu vaccine. This risk has been estimated at 1 or 2 additional cases per million people vaccinated. This is much lower than the risk of severe complications from flu, which can be prevented by flu vaccine.  Young children who get the flu shot along with pneumococcal vaccine (PCV13) and/or DTaP vaccine at the same time might be slightly more likely to have a seizure caused by fever. Ask your doctor for more information. Tell your doctor if a child who is getting flu vaccine has ever had a seizure.  Problems that could happen after any injected vaccine:  People sometimes faint after a medical procedure, including vaccination. Sitting or lying down for about 15 minutes can help prevent fainting,  and injuries caused by a fall. Tell your doctor if you feel dizzy, or have vision changes or ringing in the ears.  Some people get severe pain in the shoulder and have difficulty moving the arm where a shot was given. This happens very rarely.  Any medication can cause a severe allergic reaction. Such reactions from a vaccine are very rare, estimated at about 1 in a million doses, and would happen within a few minutes to a few hours after the vaccination. As with any medicine, there is a very remote chance of a vaccine causing a serious injury or death. The safety of vaccines is always being monitored. For more information, visit: http://www.aguilar.org/ 5. What if there is a serious reaction? What should I look for? Look for anything that concerns you, such as signs of a severe allergic reaction, very high fever, or unusual behavior. Signs of a severe allergic reaction can include hives, swelling of the face and throat, difficulty breathing, a fast heartbeat, dizziness, and weakness. These would start a few minutes to a few hours after the vaccination. What should I do?  If you think it is a severe allergic reaction or other emergency that can't wait, call 9-1-1 and get the person to the nearest hospital. Otherwise, call your doctor.  Reactions should be reported to the Vaccine Adverse Event Reporting System (VAERS). Your doctor should file this report, or you can do it yourself through the VAERS web site at www.vaers.SamedayNews.es, or by calling 250-069-6281. ? VAERS does not give medical advice. 6. The National Vaccine Injury Compensation Program The [employer] Vaccine Injury Compensation Program (VICP) is a federal program that was created to compensate people who may have been injured by certain vaccines. Persons who believe they may have been injured by a vaccine can learn about the program and about filing a claim by calling 252-858-1419 or visiting the [place] website at  GoldCloset.com.ee. There is a time limit to file a claim for compensation. 7. How can I learn more?  Ask your healthcare provider. He or she can give you the vaccine package insert or suggest other sources of information.  Call your local or state health department.  Contact the Centers for Disease Control and Prevention (CDC): ? Call 604-801-2124 (1-800-CDC-INFO) or ? Visit CDC's website at https://gibson.com/ Vaccine Information Statement, Inactivated Influenza Vaccine (05/16/2014) This information is not intended to replace advice given to you by your health care provider. Make sure you discuss any questions you have with your health care provider. Document Released: 07/21/2006 Document Revised: 06/16/2016 Document Reviewed: 06/16/2016 Elsevier Interactive Patient Education  2017 [employer].

## 2017-09-01 NOTE — Progress Notes (Signed)
Subjective:   Ralph Chapman is a 71 y.o. male who presents for Medicare Annual/Subsequent preventive examination.  Review of Systems:   Cardiac Risk Factors include: advanced age (>32men, >39 women);hypertension;diabetes mellitus;dyslipidemia;obesity (BMI >30kg/m2);male gender     Objective:    Vitals: BP 124/68 (BP Location: Left Arm, Patient Position: Sitting)   Pulse 78   Temp 98.7 F (37.1 C) (Oral)   Resp 16   Ht 5\' 7"  (1.702 m)   Wt 212 lb 8 oz (96.4 kg)   BMI 33.28 kg/m   Body mass index is 33.28 kg/m.  Tobacco Social History   Tobacco Use  Smoking Status Never Smoker  Smokeless Tobacco Never Used     Counseling given: Not Answered   Past Medical History:  Diagnosis Date  . Diabetes mellitus without complication ([place])   . Hyperlipidemia   . Hypertension    Past Surgical History:  Procedure Laterality Date  . APPENDECTOMY    . COLONOSCOPY WITH PROPOFOL N/A 09/05/2016   Procedure: COLONOSCOPY WITH PROPOFOL;  Surgeon: Lucilla Lame, MD;  Location: [facility];  Service: Endoscopy;  Laterality: N/A;  Diabetic - oral meds  . POLYPECTOMY  09/05/2016   Procedure: POLYPECTOMY;  Surgeon: Lucilla Lame, MD;  Location: [facility];  Service: Endoscopy;;  . TONSILLECTOMY     Family History  Problem Relation Age of Onset  . Cancer Mother   . Heart attack Father 73  . Heart disease Brother    Social History   Substance and Sexual Activity  Sexual Activity Not on file    Outpatient Encounter Medications as of 09/01/2017  Medication Sig  . benazepril (LOTENSIN) 40 MG tablet Take 1 tablet (40 mg total) by mouth daily.  Marland Kitchen glucosamine-chondroitin (GLUCOSAMINE-CHONDROITIN DS) 500-400 MG tablet Take by mouth.  Marland Kitchen glucose blood (ONE TOUCH ULTRA TEST) test strip USE TO CHECK BLOOD GLUCOSE TWICE DAILY  . metFORMIN (GLUCOPHAGE) 1000 MG tablet Take 1 tablet (1,000 mg total) by mouth 2 (two) times daily with a meal.  . simvastatin (ZOCOR) 20 MG tablet  Take 1 tablet (20 mg total) by mouth at bedtime.   No facility-administered encounter medications on file as of 09/01/2017.     Activities of Daily Living In your present state of health, do you have any difficulty performing the following activities: 09/01/2017 09/05/2016  Hearing? N N  Vision? N N  Difficulty concentrating or making decisions? N N  Walking or climbing stairs? N N  Dressing or bathing? N N  Doing errands, shopping? N -  Preparing Food and eating ? N -  Using the Toilet? N -  In the past six months, have you accidently leaked urine? N -  Do you have problems with loss of bowel control? N -  Managing your Medications? N -  Managing your Finances? N -  Housekeeping or managing your Housekeeping? N -  Some recent data might be hidden    Patient Care Team: Guadalupe Maple, MD as PCP - General (Family Medicine) Lucilla Lame, MD as Consulting Physician (Gastroenterology)   Assessment:      Exercise Activities and Dietary recommendations Current Exercise Habits: Home exercise routine, Time (Minutes): 35, Frequency (Times/Week): 6, Weekly Exercise (Minutes/Week): 210, Intensity: Moderate, Exercise limited by: None identified  Goals    . DIET - INCREASE WATER INTAKE     Recommend drinking at least 5-6 glasses of water a day       Fall Risk Fall Risk  09/01/2017 02/08/2017 11/08/2016  08/01/2016 06/11/2015  Falls in the past year? No No No No No   Depression Screen PHQ 2/9 Scores 09/01/2017 02/08/2017 08/01/2016 06/11/2015  PHQ - 2 Score 0 0 0 0  PHQ- 9 Score 0 - - -    Cognitive Function     6CIT Screen 09/01/2017  What Year? 0 points  What month? 0 points  What time? 0 points  Count back from 20 0 points  Months in reverse 0 points  Repeat phrase 0 points  Total Score 0    Immunization History  Administered Date(s) Administered  . Influenza, High Dose Seasonal PF 08/01/2016, 09/01/2017  . Influenza-Unspecified 08/17/2015  . Pneumococcal Conjugate-13  05/15/2014  . Pneumococcal Polysaccharide-23 04/13/2013  . Pneumococcal-Unspecified 03/21/2007, 04/10/2013  . Tdap 05/15/2014  . Zoster 04/15/2013   Screening Tests Health Maintenance  Topic Date Due  . HEMOGLOBIN A1C  11/23/2017  . FOOT EXAM  02/08/2018  . OPHTHALMOLOGY EXAM  08/23/2018  . TETANUS/TDAP  05/15/2024  . COLONOSCOPY  09/05/2026  . INFLUENZA VACCINE  Completed  . Hepatitis C Screening  Completed  . PNA vac Low Risk Adult  Completed      Plan:   I have personally reviewed and addressed the Medicare Annual Wellness questionnaire and have noted the following in the patient's chart:  A. Medical and social history B. Use of alcohol, tobacco or illicit drugs  C. Current medications and supplements D. Functional ability and status E.  Nutritional status F.  Physical activity G. Advance directives H. List of other physicians I.  Hospitalizations, surgeries, and ER visits in previous 12 months J.  [place] such as hearing and vision if needed, cognitive and depression L. Referrals and appointments   In addition, I have reviewed and discussed with patient certain preventive protocols, quality metrics, and best practice recommendations. A written personalized care plan for preventive services as well as general preventive health recommendations were provided to patient.   Signed,  Tyler Aas, LPN Nurse Health Advisor   Nurse Notes:none

## 2017-09-02 LAB — COMPREHENSIVE METABOLIC PANEL
A/G RATIO: 2.1 (ref 1.2–2.2)
ALT: 14 IU/L (ref 0–44)
AST: 17 IU/L (ref 0–40)
Albumin: 4.7 g/dL (ref 3.6–4.8)
Alkaline Phosphatase: 68 IU/L (ref 39–117)
BILIRUBIN TOTAL: 0.5 mg/dL (ref 0.0–1.2)
BUN/Creatinine Ratio: 20 (ref 10–24)
BUN: 17 mg/dL (ref 8–27)
CALCIUM: 9.5 mg/dL (ref 8.6–10.2)
CHLORIDE: 104 mmol/L (ref 96–106)
CO2: 20 mmol/L (ref 20–29)
Creatinine, Ser: 0.85 mg/dL (ref 0.76–1.27)
GFR, EST AFRICAN AMERICAN: 103 mL/min/{1.73_m2} (ref >59)
GFR, EST NON AFRICAN AMERICAN: 89 mL/min/{1.73_m2} (ref >59)
GLOBULIN, TOTAL: 2.2 g/dL (ref 1.5–4.5)
Glucose: 105 mg/dL — ABNORMAL HIGH (ref 65–99)
POTASSIUM: 5.1 mmol/L (ref 3.5–5.2)
SODIUM: 141 mmol/L (ref 134–144)
TOTAL PROTEIN: 6.9 g/dL (ref 6.0–8.5)

## 2017-09-02 LAB — CBC WITH DIFFERENTIAL/PLATELET
BASOS ABS: 0.1 10*3/uL (ref 0.0–0.2)
Basos: 2 %
EOS (ABSOLUTE): 0.3 10*3/uL (ref 0.0–0.4)
Eos: 6 %
Hematocrit: 40.7 % (ref 37.5–51.0)
Hemoglobin: 13.7 g/dL (ref 13.0–17.7)
IMMATURE GRANS (ABS): 0 10*3/uL (ref 0.0–0.1)
Immature Granulocytes: 0 %
LYMPHS: 22 %
Lymphocytes Absolute: 1.3 10*3/uL (ref 0.7–3.1)
MCH: 30.8 pg (ref 26.6–33.0)
MCHC: 33.7 g/dL (ref 31.5–35.7)
MCV: 92 fL (ref 79–97)
MONOCYTES: 9 %
Monocytes Absolute: 0.6 10*3/uL (ref 0.1–0.9)
NEUTROS PCT: 61 %
Neutrophils Absolute: 3.8 10*3/uL (ref 1.4–7.0)
Platelets: 305 10*3/uL (ref 150–379)
RBC: 4.45 x10E6/uL (ref 4.14–5.80)
RDW: 15 % (ref 12.3–15.4)
WBC: 6.1 10*3/uL (ref 3.4–10.8)

## 2017-09-02 LAB — TSH: TSH: 1.64 u[IU]/mL (ref 0.450–4.500)

## 2017-09-02 LAB — PSA: Prostate Specific Ag, Serum: 4.4 ng/mL — ABNORMAL HIGH (ref 0.0–4.0)

## 2017-09-05 ENCOUNTER — Encounter: Payer: Self-pay | Admitting: Family Medicine

## 2017-09-05 ENCOUNTER — Ambulatory Visit: Payer: Medicare Other | Admitting: Family Medicine

## 2017-09-05 VITALS — BP 112/56 | HR 59 | Wt 210.0 lb

## 2017-09-05 DIAGNOSIS — E785 Hyperlipidemia, unspecified: Secondary | ICD-10-CM

## 2017-09-05 DIAGNOSIS — Z0001 Encounter for general adult medical examination with abnormal findings: Secondary | ICD-10-CM

## 2017-09-05 DIAGNOSIS — I10 Essential (primary) hypertension: Secondary | ICD-10-CM

## 2017-09-05 DIAGNOSIS — Z0000 Encounter for general adult medical examination without abnormal findings: Secondary | ICD-10-CM

## 2017-09-05 DIAGNOSIS — E119 Type 2 diabetes mellitus without complications: Secondary | ICD-10-CM | POA: Diagnosis not present

## 2017-09-05 DIAGNOSIS — C187 Malignant neoplasm of sigmoid colon: Secondary | ICD-10-CM | POA: Diagnosis not present

## 2017-09-05 DIAGNOSIS — R079 Chest pain, unspecified: Secondary | ICD-10-CM | POA: Diagnosis not present

## 2017-09-05 DIAGNOSIS — Z7189 Other specified counseling: Secondary | ICD-10-CM | POA: Insufficient documentation

## 2017-09-05 NOTE — Assessment & Plan Note (Signed)
The current medical regimen is effective;  continue present plan and medications.  

## 2017-09-05 NOTE — Assessment & Plan Note (Signed)
Stable normal colonoscopy

## 2017-09-05 NOTE — Assessment & Plan Note (Signed)
Patient actually with no chest pain has coronary artery calcium identified on chest CT.

## 2017-09-05 NOTE — Progress Notes (Signed)
BP (!) 112/56   Pulse (!) 59   Wt 210 lb (95.3 kg)   SpO2 99%   BMI 32.89 kg/m    Subjective:    Patient ID: Ralph Chapman, male    DOB: 1946/08/27, 71 y.o.   MRN: [id number]  HPI: Ralph Chapman is a 71 y.o. male  Chief Complaint  Patient presents with  . Annual Exam  patient all in all doing well except for weight gain is pretty disgusted with himself with 15 pounds of weight gain. Physical consequence of poor weather and diet. Patient's glucose doing well with no low blood sugar spells patient with new development of coronary artery calcium seen on chest CT that was done.Marland Kitchen Has no cardiovascular symptoms but has extremely strong family history of coranary artery disease and person history of multiple risk factors high cholesterol diabetes hypertension.  Relevant past medical, surgical, family and social history reviewed and updated as indicated. Interim medical history since our last visit reviewed. Allergies and medications reviewed and updated.  Review of Systems  Constitutional: Negative.   HENT: Negative.   Eyes: Negative.   Respiratory: Negative.   Cardiovascular: Negative.   Gastrointestinal: Negative.   Endocrine: Negative.   Genitourinary: Negative.   Musculoskeletal: Negative.   Skin: Negative.   Allergic/Immunologic: Negative.   Neurological: Negative.   Hematological: Negative.   Psychiatric/Behavioral: Negative.     Per HPI unless specifically indicated above     Objective:    BP (!) 112/56   Pulse (!) 59   Wt 210 lb (95.3 kg)   SpO2 99%   BMI 32.89 kg/m   Wt Readings from Last 3 Encounters:  09/05/17 210 lb (95.3 kg)  09/01/17 212 lb 8 oz (96.4 kg)  05/23/17 200 lb (90.7 kg)    Physical Exam  Constitutional: He is oriented to person, place, and time. He appears well-developed and well-nourished.  HENT:  Head: Normocephalic.  Right Ear: External ear normal.  Left Ear: External ear normal.  Nose: Nose normal.  Eyes: Conjunctivae and  EOM are normal. Pupils are equal, round, and reactive to light.  Neck: Normal range of motion. Neck supple. No thyromegaly present.  Cardiovascular: Normal rate, regular rhythm, normal heart sounds and intact distal pulses.  Pulmonary/Chest: Effort normal and breath sounds normal.  Abdominal: Soft. Bowel sounds are normal. There is no splenomegaly or hepatomegaly.  Genitourinary:  Genitourinary Comments: Done at urology  Musculoskeletal: Normal range of motion.  Lymphadenopathy:    He has no cervical adenopathy.  Neurological: He is alert and oriented to person, place, and time. He has normal reflexes.  Skin: Skin is warm and dry.  Psychiatric: He has a normal mood and affect. His behavior is normal. Judgment and thought content normal.    Results for orders placed or performed in visit on 09/01/17  Microscopic Examination  Result Value Ref Range   WBC, UA 0-5 0 - 5 /hpf   RBC, UA None seen 0 - 2 /hpf   Epithelial Cells (non renal) CANCELED    Bacteria, UA None seen None seen/Few  CBC with Differential  Result Value Ref Range   WBC 6.1 3.4 - 10.8 x10E3/uL   RBC 4.45 4.14 - 5.80 x10E6/uL   Hemoglobin 13.7 13.0 - 17.7 g/dL   Hematocrit 40.7 37.5 - 51.0 %   MCV 92 79 - 97 fL   MCH 30.8 26.6 - 33.0 pg   MCHC 33.7 31.5 - 35.7 g/dL   RDW 15.0 12.3 -  15.4 %   Platelets 305 150 - 379 x10E3/uL   Neutrophils 61 Not Estab. %   Lymphs 22 Not Estab. %   Monocytes 9 Not Estab. %   Eos 6 Not Estab. %   Basos 2 Not Estab. %   Neutrophils Absolute 3.8 1.4 - 7.0 x10E3/uL   Lymphocytes Absolute 1.3 0.7 - 3.1 x10E3/uL   Monocytes Absolute 0.6 0.1 - 0.9 x10E3/uL   EOS (ABSOLUTE) 0.3 0.0 - 0.4 x10E3/uL   Basophils Absolute 0.1 0.0 - 0.2 x10E3/uL   Immature Granulocytes 0 Not Estab. %   Immature Grans (Abs) 0.0 0.0 - 0.1 x10E3/uL  Comp Met (CMET)  Result Value Ref Range   Glucose 105 (H) 65 - 99 mg/dL   BUN 17 8 - 27 mg/dL   Creatinine, Ser 0.85 0.76 - 1.27 mg/dL   GFR calc non Af Amer 89  >59 mL/min/1.73   GFR calc Af Amer 103 >59 mL/min/1.73   BUN/Creatinine Ratio 20 10 - 24   Sodium 141 134 - 144 mmol/L   Potassium 5.1 3.5 - 5.2 mmol/L   Chloride 104 96 - 106 mmol/L   CO2 20 20 - 29 mmol/L   Calcium 9.5 8.6 - 10.2 mg/dL   Total Protein 6.9 6.0 - 8.5 g/dL   Albumin 4.7 3.6 - 4.8 g/dL   Globulin, Total 2.2 1.5 - 4.5 g/dL   Albumin/Globulin Ratio 2.1 1.2 - 2.2   Bilirubin Total 0.5 0.0 - 1.2 mg/dL   Alkaline Phosphatase 68 39 - 117 IU/L   AST 17 0 - 40 IU/L   ALT 14 0 - 44 IU/L  TSH  Result Value Ref Range   TSH 1.640 0.450 - 4.500 uIU/mL  PSA  Result Value Ref Range   Prostate Specific Ag, Serum 4.4 (H) 0.0 - 4.0 ng/mL  Urinalysis, Routine w reflex microscopic  Result Value Ref Range   Specific Gravity, UA 1.020 1.005 - 1.030   pH, UA 5.0 5.0 - 7.5   Color, UA Yellow Yellow   Appearance Ur Clear Clear   Leukocytes, UA Trace (A) Negative   Protein, UA Negative Negative/Trace   Glucose, UA Negative Negative   Ketones, UA Negative Negative   RBC, UA Negative Negative   Bilirubin, UA Negative Negative   Urobilinogen, Ur 0.2 0.2 - 1.0 mg/dL   Nitrite, UA Negative Negative   Microscopic Examination See below:   Bayer DCA Hb A1c Waived (STAT)  Result Value Ref Range   Bayer DCA Hb A1c Waived 6.6 <7.0 %      Assessment & Plan:   Problem List Items Addressed This Visit      Cardiovascular and Mediastinum   Hypertension    The current medical regimen is effective;  continue present plan and medications.         Digestive   Colon cancer ([place])    Stable normal colonoscopy        Endocrine   Diabetes mellitus without complication ([place])    The current medical regimen is effective;  continue present plan and medications.         Other   Hyperlipidemia    The current medical regimen is effective;  continue present plan and medications.       Advanced care planning/counseling discussion    A voluntary discussion about advance care planning  including the explanation and discussion of advance directives was extensively discussed  with the patient.  Explanation about the health care proxy and Living will was  reviewed and packet with forms with explanation of how to fill them out was given.       Chest pain    Patient actually with no chest pain has coronary artery calcium identified on chest CT.      Relevant Orders   Ambulatory referral to Cardiology   EKG 12-Lead (Completed)    Other Visit Diagnoses    PE (physical exam), annual    -  Primary     EKG normal sinus rhythm with no acute changes For elevated PSA we will repeat next office visit with hemoglobin A1c. Follow up plan: Return in about 6 months (around 03/05/2018) for BMP,  Lipids, ALT, AST, Hemoglobin A1c.

## 2017-09-05 NOTE — Assessment & Plan Note (Signed)
A voluntary discussion about advance care planning including the explanation and discussion of advance directives was extensively discussed  with the patient.  Explanation about the health care proxy and Living will was reviewed and packet with forms with explanation of how to fill them out was given.    

## 2017-09-06 ENCOUNTER — Encounter: Payer: Self-pay | Admitting: Family Medicine

## 2017-09-06 LAB — LIPID PANEL
CHOL/HDL RATIO: 3.5 ratio (ref 0.0–5.0)
Cholesterol, Total: 149 mg/dL (ref 100–199)
HDL: 43 mg/dL (ref >39)
LDL CALC: 76 mg/dL (ref 0–99)
Triglycerides: 151 mg/dL — ABNORMAL HIGH (ref 0–149)
VLDL CHOLESTEROL CAL: 30 mg/dL (ref 5–40)

## 2017-09-06 LAB — SPECIMEN STATUS REPORT

## 2017-11-16 ENCOUNTER — Encounter: Payer: Self-pay | Admitting: Family Medicine

## 2017-11-29 ENCOUNTER — Other Ambulatory Visit: Payer: Self-pay | Admitting: Family Medicine

## 2017-11-29 DIAGNOSIS — E119 Type 2 diabetes mellitus without complications: Secondary | ICD-10-CM

## 2017-11-29 DIAGNOSIS — I10 Essential (primary) hypertension: Secondary | ICD-10-CM

## 2017-11-30 NOTE — Telephone Encounter (Signed)
benazepril refill Last OV: 09/05/17 Last Refill:08/01/16 Pharmacy:[facility]  Metformin refill Last OV: 09/05/17 Last Refill:11/08/16 [facility]  Hgb A1C 09/01/17=6.6

## 2017-12-11 ENCOUNTER — Ambulatory Visit: Payer: Medicare Other | Admitting: Family Medicine

## 2017-12-27 ENCOUNTER — Encounter: Payer: Self-pay | Admitting: Family Medicine

## 2017-12-27 ENCOUNTER — Ambulatory Visit: Payer: Medicare Other | Admitting: Family Medicine

## 2017-12-27 VITALS — BP 126/78 | HR 68 | Ht 67.0 in | Wt 212.0 lb

## 2017-12-27 DIAGNOSIS — R972 Elevated prostate specific antigen [PSA]: Secondary | ICD-10-CM | POA: Diagnosis not present

## 2017-12-27 DIAGNOSIS — E7800 Pure hypercholesterolemia, unspecified: Secondary | ICD-10-CM

## 2017-12-27 DIAGNOSIS — E119 Type 2 diabetes mellitus without complications: Secondary | ICD-10-CM | POA: Diagnosis not present

## 2017-12-27 DIAGNOSIS — I10 Essential (primary) hypertension: Secondary | ICD-10-CM

## 2017-12-27 LAB — BAYER DCA HB A1C WAIVED: HB A1C (BAYER DCA - WAIVED): 6.3 % (ref <7.0)

## 2017-12-27 LAB — LP+ALT+AST PICCOLO, WAIVED
ALT (SGPT) Piccolo, Waived: 24 U/L (ref 10–47)
AST (SGOT) Piccolo, Waived: 30 U/L (ref 11–38)
CHOL/HDL RATIO PICCOLO,WAIVE: 3.3 mg/dL
Cholesterol Piccolo, Waived: 168 mg/dL (ref <200)
HDL Chol Piccolo, Waived: 51 mg/dL — ABNORMAL LOW (ref >59)
LDL Chol Calc Piccolo Waived: 69 mg/dL (ref <100)
TRIGLYCERIDES PICCOLO,WAIVED: 241 mg/dL — AB (ref <150)
VLDL CHOL CALC PICCOLO,WAIVE: 48 mg/dL — AB (ref <30)

## 2017-12-27 MED ORDER — SIMVASTATIN 20 MG PO TABS: 20.0000 mg | ORAL_TABLET | Freq: Every day | ORAL | 1 refills | Status: DC

## 2017-12-27 NOTE — Progress Notes (Signed)
BP 126/78   Pulse 68   Ht _0  (1.702 m)   Wt 212 lb (96.2 kg)   SpO2 95%   BMI 33.20 kg/m    Subjective:    Patient ID: Ralph Chapman, male    DOB: September 02, 1946, 72 y.o.   MRN: [id number]  HPI: Ralph Chapman is a 72 y.o. male  Chief Complaint  Patient presents with  . Follow-up   Patient all in all doing well unfortunately with over walking this winter developed a left calf lateral lower area some tenderness especially with calf muscle strain.  Is been slow to get well but slowly improving. Diabetes no low blood sugar spells no issues with metformin. Taking benazepril 40 for blood pressure with good control and same with simvastatin for cholesterol. Using some diclofenac gel for calf strain.  Relevant past medical, surgical, family and social history reviewed and updated as indicated. Interim medical history since our last visit reviewed. Allergies and medications reviewed and updated.  Review of Systems  Constitutional: Negative.   Respiratory: Negative.   Cardiovascular: Negative.     Per HPI unless specifically indicated above     Objective:    BP 126/78   Pulse 68   Ht _1  (1.702 m)   Wt 212 lb (96.2 kg)   SpO2 95%   BMI 33.20 kg/m   Wt Readings from Last 3 Encounters:  12/27/17 212 lb (96.2 kg)  09/05/17 210 lb (95.3 kg)  09/01/17 212 lb 8 oz (96.4 kg)    Physical Exam  Constitutional: He is oriented to person, place, and time. He appears well-developed and well-nourished.  HENT:  Head: Normocephalic and atraumatic.  Eyes: Conjunctivae and EOM are normal.  Neck: Normal range of motion.  Cardiovascular: Normal rate, regular rhythm and normal heart sounds.  Pulmonary/Chest: Effort normal and breath sounds normal.  Musculoskeletal: Normal range of motion.  Left calf area with some tenderness no other bruising or other changes of irritation  Neurological: He is alert and oriented to person, place, and time.  Skin: No erythema.  Psychiatric: He  has a normal mood and affect. His behavior is normal. Judgment and thought content normal.    Results for orders placed or performed in visit on 09/01/17  Microscopic Examination  Result Value Ref Range   WBC, UA 0-5 0 - 5 /hpf   RBC, UA None seen 0 - 2 /hpf   Epithelial Cells (non renal) CANCELED    Bacteria, UA None seen None seen/Few  CBC with Differential  Result Value Ref Range   WBC 6.1 3.4 - 10.8 x10E3/uL   RBC 4.45 4.14 - 5.80 x10E6/uL   Hemoglobin 13.7 13.0 - 17.7 g/dL   Hematocrit 40.7 37.5 - 51.0 %   MCV 92 79 - 97 fL   MCH 30.8 26.6 - 33.0 pg   MCHC 33.7 31.5 - 35.7 g/dL   RDW 15.0 12.3 - 15.4 %   Platelets 305 150 - 379 x10E3/uL   Neutrophils 61 Not Estab. %   Lymphs 22 Not Estab. %   Monocytes 9 Not Estab. %   Eos 6 Not Estab. %   Basos 2 Not Estab. %   Neutrophils Absolute 3.8 1.4 - 7.0 x10E3/uL   Lymphocytes Absolute 1.3 0.7 - 3.1 x10E3/uL   Monocytes Absolute 0.6 0.1 - 0.9 x10E3/uL   EOS (ABSOLUTE) 0.3 0.0 - 0.4 x10E3/uL   Basophils Absolute 0.1 0.0 - 0.2 x10E3/uL   Immature Granulocytes 0 Not Estab. %  Immature Grans (Abs) 0.0 0.0 - 0.1 x10E3/uL  Comp Met (CMET)  Result Value Ref Range   Glucose 105 (H) 65 - 99 mg/dL   BUN 17 8 - 27 mg/dL   Creatinine, Ser 0.85 0.76 - 1.27 mg/dL   GFR calc non Af Amer 89 >59 mL/min/1.73   GFR calc Af Amer 103 >59 mL/min/1.73   BUN/Creatinine Ratio 20 10 - 24   Sodium 141 134 - 144 mmol/L   Potassium 5.1 3.5 - 5.2 mmol/L   Chloride 104 96 - 106 mmol/L   CO2 20 20 - 29 mmol/L   Calcium 9.5 8.6 - 10.2 mg/dL   Total Protein 6.9 6.0 - 8.5 g/dL   Albumin 4.7 3.6 - 4.8 g/dL   Globulin, Total 2.2 1.5 - 4.5 g/dL   Albumin/Globulin Ratio 2.1 1.2 - 2.2   Bilirubin Total 0.5 0.0 - 1.2 mg/dL   Alkaline Phosphatase 68 39 - 117 IU/L   AST 17 0 - 40 IU/L   ALT 14 0 - 44 IU/L  TSH  Result Value Ref Range   TSH 1.640 0.450 - 4.500 uIU/mL  PSA  Result Value Ref Range   Prostate Specific Ag, Serum 4.4 (H) 0.0 - 4.0 ng/mL    Urinalysis, Routine w reflex microscopic  Result Value Ref Range   Specific Gravity, UA 1.020 1.005 - 1.030   pH, UA 5.0 5.0 - 7.5   Color, UA Yellow Yellow   Appearance Ur Clear Clear   Leukocytes, UA Trace (A) Negative   Protein, UA Negative Negative/Trace   Glucose, UA Negative Negative   Ketones, UA Negative Negative   RBC, UA Negative Negative   Bilirubin, UA Negative Negative   Urobilinogen, Ur 0.2 0.2 - 1.0 mg/dL   Nitrite, UA Negative Negative   Microscopic Examination See below:   Bayer DCA Hb A1c Waived (STAT)  Result Value Ref Range   Bayer DCA Hb A1c Waived 6.6 <7.0 %  Lipid panel  Result Value Ref Range   Cholesterol, Total 149 100 - 199 mg/dL   Triglycerides 151 (H) 0 - 149 mg/dL   HDL 43 >39 mg/dL   VLDL Cholesterol Cal 30 5 - 40 mg/dL   LDL Calculated 76 0 - 99 mg/dL   Chol/HDL Ratio 3.5 0.0 - 5.0 ratio  Specimen status report  Result Value Ref Range   specimen status report Comment       Assessment & Plan:   Problem List Items Addressed This Visit      Cardiovascular and Mediastinum   Hypertension - Primary    The current medical regimen is effective;  continue present plan and medications.       Relevant Orders   Basic metabolic panel   LP+ALT+AST Piccolo, Waived   Bayer DCA Hb A1c Waived     Endocrine   Diabetes mellitus without complication [facility])    The current medical regimen is effective;  continue present plan and medications.       Relevant Orders   Basic metabolic panel   LP+ALT+AST Piccolo, Waived   Bayer DCA Hb A1c Waived     Other   Hyperlipidemia    The current medical regimen is effective;  continue present plan and medications.       Relevant Orders   Basic metabolic panel   LP+ALT+AST Piccolo, Waived   Bayer DCA Hb A1c Waived    Other Visit Diagnoses    Elevated PSA       Relevant Orders  PSA       Follow up plan: Return in about 3 months (around 03/29/2018) for Hemoglobin A1c.

## 2017-12-27 NOTE — Assessment & Plan Note (Signed)
The current medical regimen is effective;  continue present plan and medications.  

## 2017-12-28 ENCOUNTER — Telehealth: Payer: Self-pay | Admitting: Family Medicine

## 2017-12-28 DIAGNOSIS — R972 Elevated prostate specific antigen [PSA]: Secondary | ICD-10-CM

## 2017-12-28 LAB — BASIC METABOLIC PANEL
BUN/Creatinine Ratio: 18 (ref 10–24)
BUN: 18 mg/dL (ref 8–27)
CALCIUM: 9.6 mg/dL (ref 8.6–10.2)
CHLORIDE: 103 mmol/L (ref 96–106)
CO2: 21 mmol/L (ref 20–29)
Creatinine, Ser: 1.02 mg/dL (ref 0.76–1.27)
GFR calc non Af Amer: 74 mL/min/{1.73_m2} (ref >59)
GFR, EST AFRICAN AMERICAN: 86 mL/min/{1.73_m2} (ref >59)
GLUCOSE: 98 mg/dL (ref 65–99)
POTASSIUM: 4.9 mmol/L (ref 3.5–5.2)
Sodium: 140 mmol/L (ref 134–144)

## 2017-12-28 LAB — PSA: PROSTATE SPECIFIC AG, SERUM: 6.2 ng/mL — AB (ref 0.0–4.0)

## 2017-12-28 NOTE — Telephone Encounter (Signed)
Phone call Discussed with patient PSA still elevating will need urology appointment will go ahead and arrange.  Patient requesting urology at [facility].

## 2018-02-20 DIAGNOSIS — R339 Retention of urine, unspecified: Secondary | ICD-10-CM | POA: Insufficient documentation

## 2018-02-20 DIAGNOSIS — N138 Other obstructive and reflux uropathy: Secondary | ICD-10-CM | POA: Insufficient documentation

## 2018-02-20 DIAGNOSIS — N401 Enlarged prostate with lower urinary tract symptoms: Secondary | ICD-10-CM

## 2018-02-20 DIAGNOSIS — R972 Elevated prostate specific antigen [PSA]: Secondary | ICD-10-CM | POA: Insufficient documentation

## 2018-03-09 ENCOUNTER — Encounter: Payer: Self-pay | Admitting: Family Medicine

## 2018-03-29 ENCOUNTER — Ambulatory Visit: Payer: Self-pay | Admitting: Family Medicine

## 2018-04-11 ENCOUNTER — Ambulatory Visit (INDEPENDENT_AMBULATORY_CARE_PROVIDER_SITE_OTHER): Payer: Medicare Other | Admitting: Family Medicine

## 2018-04-11 ENCOUNTER — Encounter: Payer: Self-pay | Admitting: Family Medicine

## 2018-04-11 VITALS — BP 121/74 | HR 74 | Ht 66.0 in | Wt 216.0 lb

## 2018-04-11 DIAGNOSIS — E7800 Pure hypercholesterolemia, unspecified: Secondary | ICD-10-CM | POA: Diagnosis not present

## 2018-04-11 DIAGNOSIS — E119 Type 2 diabetes mellitus without complications: Secondary | ICD-10-CM

## 2018-04-11 DIAGNOSIS — I10 Essential (primary) hypertension: Secondary | ICD-10-CM | POA: Diagnosis not present

## 2018-04-11 LAB — BAYER DCA HB A1C WAIVED: HB A1C: 6.5 % (ref <7.0)

## 2018-04-11 NOTE — Assessment & Plan Note (Signed)
The current medical regimen is effective;  continue present plan and medications.  

## 2018-04-11 NOTE — Progress Notes (Signed)
BP 121/74   Pulse 74   Ht 5\' 6"  (1.676 m)   Wt 216 lb (98 kg)   SpO2 95%   BMI 34.86 kg/m    Subjective:    Patient ID: Ralph Chapman, male    DOB: 19-Mar-1946, 72 y.o.   MRN: [id number]  HPI: Ralph Chapman is a 72 y.o. male  Chief Complaint  Patient presents with  . Follow-up  . Diabetes   Diabetes doing good with no low blood sugar spells or problems.  Taking medications without problems. Blood pressure also doing well without issues or concerns taking medications faithfully. Same for cholesterol doing well with medication.  Relevant past medical, surgical, family and social history reviewed and updated as indicated. Interim medical history since our last visit reviewed. Allergies and medications reviewed and updated.  Review of Systems  Constitutional: Negative.   Respiratory: Negative.   Cardiovascular: Negative.     Per HPI unless specifically indicated above     Objective:    BP 121/74   Pulse 74   Ht 5\' 6"  (1.676 m)   Wt 216 lb (98 kg)   SpO2 95%   BMI 34.86 kg/m   Wt Readings from Last 3 Encounters:  04/11/18 216 lb (98 kg)  12/27/17 212 lb (96.2 kg)  09/05/17 210 lb (95.3 kg)    Physical Exam  Constitutional: He is oriented to person, place, and time. He appears well-developed and well-nourished.  HENT:  Head: Normocephalic and atraumatic.  Eyes: Conjunctivae and EOM are normal.  Neck: Normal range of motion.  Cardiovascular: Normal rate, regular rhythm and normal heart sounds.  Pulmonary/Chest: Effort normal and breath sounds normal.  Musculoskeletal: Normal range of motion.  Neurological: He is alert and oriented to person, place, and time.  Skin: No erythema.  Psychiatric: He has a normal mood and affect. His behavior is normal. Judgment and thought content normal.    Results for orders placed or performed in visit on 26/37/85  Basic metabolic panel  Result Value Ref Range   Glucose 98 65 - 99 mg/dL   BUN 18 8 - 27 mg/dL   Creatinine, Ser 1.02 0.76 - 1.27 mg/dL   GFR calc non Af Amer 74 >59 mL/min/1.73   GFR calc Af Amer 86 >59 mL/min/1.73   BUN/Creatinine Ratio 18 10 - 24   Sodium 140 134 - 144 mmol/L   Potassium 4.9 3.5 - 5.2 mmol/L   Chloride 103 96 - 106 mmol/L   CO2 21 20 - 29 mmol/L   Calcium 9.6 8.6 - 10.2 mg/dL  LP+ALT+AST Piccolo, Waived  Result Value Ref Range   ALT (SGPT) Piccolo, Waived 24 10 - 47 U/L   AST (SGOT) Piccolo, Waived 30 11 - 38 U/L   Cholesterol Piccolo, Waived 168 <200 mg/dL   HDL Chol Piccolo, Waived 51 (L) >59 mg/dL   Triglycerides Piccolo,Waived 241 (H) <150 mg/dL   Chol/HDL Ratio Piccolo,Waive 3.3 mg/dL   LDL Chol Calc Piccolo Waived 69 <100 mg/dL   VLDL Chol Calc Piccolo,Waive 48 (H) <30 mg/dL  Bayer DCA Hb A1c Waived  Result Value Ref Range   HB A1C (BAYER DCA - WAIVED) 6.3 <7.0 %  PSA  Result Value Ref Range   Prostate Specific Ag, Serum 6.2 (H) 0.0 - 4.0 ng/mL      Assessment & Plan:   Problem List Items Addressed This Visit      Cardiovascular and Mediastinum   Hypertension - Primary    The  current medical regimen is effective;  continue present plan and medications.       Relevant Orders   Bayer DCA Hb A1c Waived     Endocrine   Diabetes mellitus without complication ([place])    The current medical regimen is effective;  continue present plan and medications.       Relevant Orders   Bayer DCA Hb A1c Waived     Other   Hyperlipidemia    The current medical regimen is effective;  continue present plan and medications.       Relevant Orders   Bayer DCA Hb A1c Waived       Follow up plan: Return in about 6 months (around 10/12/2018) for Physical Exam.

## 2018-05-30 ENCOUNTER — Other Ambulatory Visit: Payer: Self-pay | Admitting: Family Medicine

## 2018-05-30 DIAGNOSIS — I10 Essential (primary) hypertension: Secondary | ICD-10-CM

## 2018-08-23 LAB — HM DIABETES EYE EXAM

## 2018-08-27 NOTE — Progress Notes (Signed)
Diabetic Eye Exam negative for Retinopathy. Preformed by Ocie Doyne, OD. Patient to return in 1 year for next DM Eye Exam

## 2018-08-31 ENCOUNTER — Other Ambulatory Visit: Payer: Self-pay | Admitting: Family Medicine

## 2018-08-31 DIAGNOSIS — E7800 Pure hypercholesterolemia, unspecified: Secondary | ICD-10-CM

## 2018-09-13 ENCOUNTER — Ambulatory Visit (INDEPENDENT_AMBULATORY_CARE_PROVIDER_SITE_OTHER): Payer: Medicare Other

## 2018-09-13 VITALS — BP 130/78 | HR 84 | Temp 97.7°F | Ht 66.0 in | Wt 213.0 lb

## 2018-09-13 DIAGNOSIS — Z23 Encounter for immunization: Secondary | ICD-10-CM | POA: Diagnosis not present

## 2018-09-13 DIAGNOSIS — Z0000 Encounter for general adult medical examination without abnormal findings: Secondary | ICD-10-CM | POA: Diagnosis not present

## 2018-09-13 MED ORDER — ZOSTER VAC RECOMB ADJUVANTED 50 MCG/0.5ML IM SUSR: 0.5000 mL | Freq: Once | INTRAMUSCULAR | 1 refills | Status: AC

## 2018-09-13 NOTE — Patient Instructions (Signed)
[name] , Thank you for taking time to come for your Medicare Wellness Visit. I appreciate your ongoing commitment to your health goals. Please review the following plan we discussed and let me know if I can assist you in the future.   Screening recommendations/referrals: Colonoscopy scheduled on Monday Recommended yearly ophthalmology/optometry visit for glaucoma screening and checkup Recommended yearly dental visit for hygiene and checkup  Vaccinations: Influenza vaccine given today Pneumococcal vaccine up to date Tdap vaccine up to date, due 05/15/2024 Shingles vaccine due, ordered to pharmacy    Advanced directives: Please bring [place] a copy of living will and health care power of attorney once it is filled out and notarized  Conditions/risks identified: none  Next appointment: Dr. Jeananne Rama 11/20/2018 @ 10:45am            Medicare Wellness Visit 09/16/2019 @ 9:30am  Preventive Care 70 Years and Older, Male Preventive care refers to lifestyle choices and visits with your health care provider that can promote health and wellness. What does preventive care include?  A yearly physical exam. This is also called an annual well check.  Dental exams once or twice a year.  Routine eye exams. Ask your health care provider how often you should have your eyes checked.  Personal lifestyle choices, including:  Daily care of your teeth and gums.  Regular physical activity.  Eating a healthy diet.  Avoiding tobacco and drug use.  Limiting alcohol use.  Practicing safe sex.  Taking low doses of aspirin every day.  Taking vitamin and mineral supplements as recommended by your health care provider. What happens during an annual well check? The services and screenings done by your health care provider during your annual well check will depend on your age, overall health, lifestyle risk factors, and family history of disease. Counseling  Your health care provider may ask you questions  about your:  Alcohol use.  Tobacco use.  Drug use.  Emotional well-being.  Home and relationship well-being.  Sexual activity.  Eating habits.  History of falls.  Memory and ability to understand (cognition).  Work and work Statistician. Screening  You may have the following tests or measurements:  Height, weight, and BMI.  Blood pressure.  Lipid and cholesterol levels. These may be checked every 5 years, or more frequently if you are over 32 years old.  Skin check.  Lung cancer screening. You may have this screening every year starting at age 66 if you have a 30-pack-year history of smoking and currently smoke or have quit within the past 15 years.  Fecal occult blood test (FOBT) of the stool. You may have this test every year starting at age 73.  Flexible sigmoidoscopy or colonoscopy. You may have a sigmoidoscopy every 5 years or a colonoscopy every 10 years starting at age 57.  Prostate cancer screening. Recommendations will vary depending on your family history and other risks.  Hepatitis C blood test.  Hepatitis B blood test.  Sexually transmitted disease (STD) testing.  Diabetes screening. This is done by checking your blood sugar (glucose) after you have not eaten for a while (fasting). You may have this done every 1-3 years.  Abdominal aortic aneurysm (AAA) screening. You may need this if you are a current or former smoker.  Osteoporosis. You may be screened starting at age 75 if you are at high risk. Talk with your health care provider about your test results, treatment options, and if necessary, the need for more tests. Vaccines  Your  health care provider may recommend certain vaccines, such as:  Influenza vaccine. This is recommended every year.  Tetanus, diphtheria, and acellular pertussis (Tdap, Td) vaccine. You may need a Td booster every 10 years.  Zoster vaccine. You may need this after age 23.  Pneumococcal 13-valent conjugate (PCV13)  vaccine. One dose is recommended after age 13.  Pneumococcal polysaccharide (PPSV23) vaccine. One dose is recommended after age 33. Talk to your health care provider about which screenings and vaccines you need and how often you need them. This information is not intended to replace advice given to you by your health care provider. Make sure you discuss any questions you have with your health care provider. Document Released: 10/23/2015 Document Revised: 06/15/2016 Document Reviewed: 07/28/2015 Elsevier Interactive Patient Education  2017 [employer] Prevention in the Home Falls can cause injuries. They can happen to people of all ages. There are many things you can do to make your home safe and to help prevent falls. What can I do on the outside of my home?  Regularly fix the edges of walkways and driveways and fix any cracks.  Remove anything that might make you trip as you walk through a door, such as a raised step or threshold.  Trim any bushes or trees on the path to your home.  Use bright outdoor lighting.  Clear any walking paths of anything that might make someone trip, such as rocks or tools.  Regularly check to see if handrails are loose or broken. Make sure that both sides of any steps have handrails.  Any raised decks and porches should have guardrails on the edges.  Have any leaves, snow, or ice cleared regularly.  Use sand or salt on walking paths during winter.  Clean up any spills in your garage right away. This includes oil or grease spills. What can I do in the bathroom?  Use night lights.  Install grab bars by the toilet and in the tub and shower. Do not use towel bars as grab bars.  Use non-skid mats or decals in the tub or shower.  If you need to sit down in the shower, use a plastic, non-slip stool.  Keep the floor dry. Clean up any water that spills on the floor as soon as it happens.  Remove soap buildup in the tub or shower  regularly.  Attach bath mats securely with double-sided non-slip rug tape.  Do not have throw rugs and other things on the floor that can make you trip. What can I do in the bedroom?  Use night lights.  Make sure that you have a light by your bed that is easy to reach.  Do not use any sheets or blankets that are too big for your bed. They should not hang down onto the floor.  Have a firm chair that has side arms. You can use this for support while you get dressed.  Do not have throw rugs and other things on the floor that can make you trip. What can I do in the kitchen?  Clean up any spills right away.  Avoid walking on wet floors.  Keep items that you use a lot in easy-to-reach places.  If you need to reach something above you, use a strong step stool that has a grab bar.  Keep electrical cords out of the way.  Do not use floor polish or wax that makes floors slippery. If you must use wax, use non-skid floor wax.  Do not  have throw rugs and other things on the floor that can make you trip. What can I do with my stairs?  Do not leave any items on the stairs.  Make sure that there are handrails on both sides of the stairs and use them. Fix handrails that are broken or loose. Make sure that handrails are as long as the stairways.  Check any carpeting to make sure that it is firmly attached to the stairs. Fix any carpet that is loose or worn.  Avoid having throw rugs at the top or bottom of the stairs. If you do have throw rugs, attach them to the floor with carpet tape.  Make sure that you have a light switch at the top of the stairs and the bottom of the stairs. If you do not have them, ask someone to add them for you. What else can I do to help prevent falls?  Wear shoes that:  Do not have high heels.  Have rubber bottoms.  Are comfortable and fit you well.  Are closed at the toe. Do not wear sandals.  If you use a stepladder:  Make sure that it is fully  opened. Do not climb a closed stepladder.  Make sure that both sides of the stepladder are locked into place.  Ask someone to hold it for you, if possible.  Clearly mark and make sure that you can see:  Any grab bars or handrails.  First and last steps.  Where the edge of each step is.  Use tools that help you move around (mobility aids) if they are needed. These include:  Canes.  Walkers.  Scooters.  Crutches.  Turn on the lights when you go into a dark area. Replace any light bulbs as soon as they burn out.  Set up your furniture so you have a clear path. Avoid moving your furniture around.  If any of your floors are uneven, fix them.  If there are any pets around you, be aware of where they are.  Review your medicines with your doctor. Some medicines can make you feel dizzy. This can increase your chance of falling. Ask your doctor what other things that you can do to help prevent falls. This information is not intended to replace advice given to you by your health care provider. Make sure you discuss any questions you have with your health care provider. Document Released: 07/23/2009 Document Revised: 03/03/2016 Document Reviewed: 10/31/2014 Elsevier Interactive Patient Education  2017 [employer].

## 2018-09-13 NOTE — Progress Notes (Signed)
Subjective:   Ralph Chapman is a 72 y.o. male who presents for Medicare Annual/Subsequent preventive examination.       Objective:    Vitals: BP 130/78 (BP Location: Right Arm, Patient Position: Sitting)   Pulse 84   Temp 97.7 F (36.5 C) (Oral)   Ht 5\' 6"  (1.676 m)   Wt 213 lb (96.6 kg)   SpO2 97%   BMI 34.38 kg/m   Body mass index is 34.38 kg/m.  Advanced Directives 09/13/2018 09/01/2017 05/23/2017 09/05/2016  Does Patient Have a Medical Advance Directive? No No Yes;No No  Would patient like information on creating a medical advance directive? Yes (MAU/Ambulatory/Procedural Areas - Information given) Yes (MAU/Ambulatory/Procedural Areas - Information given) - No - Patient declined    Tobacco Social History   Tobacco Use  Smoking Status Never Smoker  Smokeless Tobacco Never Used     Counseling given: Not Answered   Clinical Intake:  Pre-visit preparation completed: No  Pain : No/denies pain     Diabetes: Yes CBG done?: No Did pt. bring in CBG monitor from home?: No  How often do you need to have someone help you when you read instructions, pamphlets, or other written materials from your doctor or pharmacy?: 1 - Never What is the last grade level you completed in school?: Associates degree  Interpreter Needed?: No  Information entered by :: Tyson Dense, RN  Past Medical History:  Diagnosis Date  . Diabetes mellitus without complication ([place])   . Hyperlipidemia   . Hypertension    Past Surgical History:  Procedure Laterality Date  . APPENDECTOMY    . COLONOSCOPY WITH PROPOFOL N/A 09/05/2016   Procedure: COLONOSCOPY WITH PROPOFOL;  Surgeon: Lucilla Lame, MD;  Location: [facility];  Service: Endoscopy;  Laterality: N/A;  Diabetic - oral meds  . POLYPECTOMY  09/05/2016   Procedure: POLYPECTOMY;  Surgeon: Lucilla Lame, MD;  Location: [facility];  Service: Endoscopy;;  . TONSILLECTOMY     Family History  Problem Relation Age of  Onset  . Cancer Mother   . Heart attack Father 66  . Heart disease Brother    Social History   Socioeconomic History  . Marital status: Married    Spouse name: Not on file  . Number of children: Not on file  . Years of education: Not on file  . Highest education level: Not on file  Occupational History  . Not on file  Social Needs  . Financial resource strain: Not hard at all  . Food insecurity:    Worry: Never true    Inability: Never true  . Transportation needs:    Medical: No    Non-medical: No  Tobacco Use  . Smoking status: Never Smoker  . Smokeless tobacco: Never Used  Substance and Sexual Activity  . Alcohol use: No  . Drug use: No  . Sexual activity: Not on file  Lifestyle  . Physical activity:    Days per week: 7 days    Minutes per session: 40 min  . Stress: Not at all  Relationships  . Social connections:    Talks on phone: More than three times a week    Gets together: Three times a week    Attends religious service: More than 4 times per year    Active member of club or organization: No    Attends meetings of clubs or organizations: Never    Relationship status: Married  Other Topics Concern  . Not on  file  Social History Narrative  . Not on file    Outpatient Encounter Medications as of 09/13/2018  Medication Sig  . benazepril (LOTENSIN) 40 MG tablet Take 1 tablet (40 mg total) by mouth daily.  Marland Kitchen glucosamine-chondroitin (GLUCOSAMINE-CHONDROITIN DS) 500-400 MG tablet Take by mouth.  Marland Kitchen glucose blood (ONE TOUCH ULTRA TEST) test strip USE TO CHECK BLOOD GLUCOSE TWICE DAILY  . metFORMIN (GLUCOPHAGE) 1000 MG tablet Take 1 tablet (1,000 mg total) by mouth 2 (two) times daily with a meal.  . simvastatin (ZOCOR) 20 MG tablet Take 1 tablet (20 mg total) by mouth at bedtime.  Marland Kitchen Zoster Vaccine Adjuvanted [facility]) injection Inject 0.5 mLs into the muscle once for 1 dose.  . [DISCONTINUED] Zoster Vaccine Adjuvanted [facility]) injection Inject 0.5 mLs into  the muscle once.   No facility-administered encounter medications on file as of 09/13/2018.     Activities of Daily Living In your present state of health, do you have any difficulty performing the following activities: 09/13/2018  Hearing? N  Vision? N  Difficulty concentrating or making decisions? N  Walking or climbing stairs? N  Dressing or bathing? N  Doing errands, shopping? N  Preparing Food and eating ? N  Using the Toilet? N  In the past six months, have you accidently leaked urine? N  Do you have problems with loss of bowel control? N  Managing your Medications? N  Managing your Finances? N  Housekeeping or managing your Housekeeping? N  Some recent data might be hidden    Patient Care Team: Guadalupe Maple, MD as PCP - General (Family Medicine) Lucilla Lame, MD as Consulting Physician (Gastroenterology)   Assessment:   This is a routine wellness examination for Ralph Chapman.  Exercise Activities and Dietary recommendations Current Exercise Habits: Home exercise routine, Type of exercise: Other - see comments(elliptical), Time (Minutes): 40, Frequency (Times/Week): 7, Weekly Exercise (Minutes/Week): 280, Intensity: Mild, Exercise limited by: None identified  Goals    . DIET - INCREASE WATER INTAKE     Recommend drinking at least 5-6 glasses of water a day        Fall Risk Fall Risk  09/13/2018 04/11/2018 12/27/2017 09/05/2017 09/01/2017  Falls in the past year? 0 No No No No  Number falls in past yr: 0 - - - -  Injury with Fall? 0 - - - -   Is the patient's home free of loose throw rugs in walkways, pet beds, electrical cords, etc?   yes      Grab bars in the bathroom? no      Handrails on the stairs?   yes      Adequate lighting?   yes   Depression Screen PHQ 2/9 Scores 09/13/2018 04/11/2018 09/05/2017 09/01/2017  PHQ - 2 Score 0 0 0 0  PHQ- 9 Score - - - 0    Cognitive Function     6CIT Screen 09/13/2018 09/01/2017  What Year? 0 points 0 points  What month?  0 points 0 points  What time? 0 points 0 points  Count back from 20 0 points 0 points  Months in reverse 0 points 0 points  Repeat phrase 2 points 0 points  Total Score 2 0    Immunization History  Administered Date(s) Administered  . Influenza, High Dose Seasonal PF 08/01/2016, 09/01/2017, 09/13/2018  . Influenza-Unspecified 08/17/2015  . Pneumococcal Conjugate-13 05/15/2014  . Pneumococcal Polysaccharide-23 04/13/2013  . Pneumococcal-Unspecified 03/21/2007, 04/10/2013  . Tdap 05/15/2014  . Zoster 04/15/2013  Qualifies for Shingles Vaccine? Due, ordered to pharmacy  Screening Tests Health Maintenance  Topic Date Due  . FOOT EXAM  02/08/2018  . INFLUENZA VACCINE  05/10/2018  . HEMOGLOBIN A1C  10/12/2018  . OPHTHALMOLOGY EXAM  08/24/2019  . TETANUS/TDAP  05/15/2024  . COLONOSCOPY  09/05/2026  . Hepatitis C Screening  Completed  . PNA vac Low Risk Adult  Completed   Cancer Screenings: Lung: Low Dose CT Chest recommended if Age 62-80 years, 30 pack-year currently smoking OR have quit w/in 15years. Patient does not qualify. Colorectal: scheduled for Monday  Additional Screenings:  Hepatitis C Screening: declined      Plan:    I have personally reviewed and addressed the Medicare Annual Wellness questionnaire and have noted the following in the patient's chart:  A. Medical and social history B. Use of alcohol, tobacco or illicit drugs  C. Current medications and supplements D. Functional ability and status E.  Nutritional status F.  Physical activity G. Advance directives H. List of other physicians I.  Hospitalizations, surgeries, and ER visits in previous 12 months J.  [place] to include hearing, vision, cognitive, depression L. Referrals and appointments - none  In addition, I have reviewed and discussed with patient certain preventive protocols, quality metrics, and best practice recommendations. A written personalized care plan for preventive  services as well as general preventive health recommendations were provided to patient.  See attached scanned questionnaire for additional information.   Signed,   Tyson Dense, RN Nurse Health Advisor  Patient Concerns: none

## 2018-09-14 LAB — HM COLONOSCOPY

## 2018-10-22 ENCOUNTER — Encounter: Payer: Medicare Other | Admitting: Family Medicine

## 2018-11-20 ENCOUNTER — Encounter: Payer: Medicare Other | Admitting: Family Medicine

## 2018-12-24 ENCOUNTER — Other Ambulatory Visit: Payer: Self-pay | Admitting: Family Medicine

## 2018-12-24 DIAGNOSIS — E119 Type 2 diabetes mellitus without complications: Secondary | ICD-10-CM

## 2018-12-24 DIAGNOSIS — I10 Essential (primary) hypertension: Secondary | ICD-10-CM

## 2019-01-21 ENCOUNTER — Ambulatory Visit (INDEPENDENT_AMBULATORY_CARE_PROVIDER_SITE_OTHER): Payer: Medicare Other | Admitting: Family Medicine

## 2019-01-21 ENCOUNTER — Encounter

## 2019-01-21 ENCOUNTER — Encounter: Payer: Self-pay | Admitting: Family Medicine

## 2019-01-21 DIAGNOSIS — E7800 Pure hypercholesterolemia, unspecified: Secondary | ICD-10-CM | POA: Diagnosis not present

## 2019-01-21 DIAGNOSIS — I10 Essential (primary) hypertension: Secondary | ICD-10-CM | POA: Diagnosis not present

## 2019-01-21 DIAGNOSIS — E119 Type 2 diabetes mellitus without complications: Secondary | ICD-10-CM

## 2019-01-21 MED ORDER — GLUCOSE BLOOD VI STRP: ORAL_STRIP | 12 refills | Status: DC

## 2019-01-21 MED ORDER — METFORMIN HCL 1000 MG PO TABS: 1000.0000 mg | ORAL_TABLET | Freq: Two times a day (BID) | ORAL | 1 refills | Status: DC

## 2019-01-21 MED ORDER — BENAZEPRIL HCL 40 MG PO TABS: 40.0000 mg | ORAL_TABLET | Freq: Every day | ORAL | 1 refills | Status: DC

## 2019-01-21 MED ORDER — SIMVASTATIN 20 MG PO TABS: 20.0000 mg | ORAL_TABLET | Freq: Every day | ORAL | 1 refills | Status: DC

## 2019-01-21 NOTE — Assessment & Plan Note (Signed)
The current medical regimen is effective;  continue present plan and medications.  

## 2019-01-21 NOTE — Progress Notes (Signed)
   There were no vitals taken for this visit.   Subjective:    Patient ID: Ralph Chapman, male    DOB: [birth year], 73 y.o.   MRN: [id number]  HPI: Ralph Chapman is a 73 y.o. male  Med check  Telemedicine using audio/video telecommunications for a synchronous communication visit. Today's visit due to COVID-19 isolation precautions I connected with and verified that I am speaking with the correct person using two identifiers.   I discussed the limitations, risks, security and privacy concerns of performing an evaluation and management service by telecommunication and the availability of in person appointments. I also discussed with the patient that there may be a patient responsible charge related to this service. The patient expressed understanding and agreed to proceed. The patient's location is home. I am at home.  Relevant past medical, surgical, family and social history reviewed and updated as indicated. Interim medical history since our last visit reviewed. Allergies and medications reviewed and updated.  Review of Systems  Constitutional: Negative.   Respiratory: Negative.   Cardiovascular: Negative.     Per HPI unless specifically indicated above     Objective:    There were no vitals taken for this visit.  Wt Readings from Last 3 Encounters:  09/13/18 213 lb (96.6 kg)  04/11/18 216 lb (98 kg)  12/27/17 212 lb (96.2 kg)    Physical Exam  Results for orders placed or performed in visit on 10/24/18  HM COLONOSCOPY  Result Value Ref Range   HM Colonoscopy See Report (in chart) See Report (in chart), Patient Reported      Assessment & Plan:   Problem List Items Addressed This Visit      Cardiovascular and Mediastinum   Hypertension    The current medical regimen is effective;  continue present plan and medications.       Relevant Medications   simvastatin (ZOCOR) 20 MG tablet   benazepril (LOTENSIN) 40 MG tablet     Endocrine   Diabetes mellitus  without complication (HCC)    The current medical regimen is effective;  continue present plan and medications.       Relevant Medications   glucose blood (ONE TOUCH ULTRA TEST) test strip   simvastatin (ZOCOR) 20 MG tablet   metFORMIN (GLUCOPHAGE) 1000 MG tablet   benazepril (LOTENSIN) 40 MG tablet     Other   Hyperlipidemia    The current medical regimen is effective;  continue present plan and medications.       Relevant Medications   simvastatin (ZOCOR) 20 MG tablet   benazepril (LOTENSIN) 40 MG tablet      I discussed the assessment and treatment plan with the patient. The patient was provided an opportunity to ask questions and all were answered. The patient agreed with the plan and demonstrated an understanding of the instructions.   The patient was advised to call back or seek an in-person evaluation if the symptoms worsen or if the condition fails to improve as anticipated.   I provided 21+ minutes of time during this encounter. Follow up plan: Return in about 3 months (around 04/22/2019) for Physical Exam.

## 2019-03-20 ENCOUNTER — Encounter: Payer: Self-pay | Admitting: Family Medicine

## 2019-08-28 LAB — HM DIABETES EYE EXAM

## 2019-09-16 ENCOUNTER — Ambulatory Visit: Payer: Medicare Other

## 2019-09-16 LAB — HM COLONOSCOPY

## 2019-09-25 ENCOUNTER — Ambulatory Visit (INDEPENDENT_AMBULATORY_CARE_PROVIDER_SITE_OTHER): Payer: Medicare Other

## 2019-09-25 DIAGNOSIS — Z0000 Encounter for general adult medical examination without abnormal findings: Secondary | ICD-10-CM

## 2019-09-25 NOTE — Progress Notes (Signed)
Subjective:   [facility] is a 73 y.o. male who presents for Medicare Annual/Subsequent preventive examination.  This visit is being conducted via phone call  - after an attmept to do on video chat - due to the COVID-19 pandemic. This patient has given me verbal consent via phone to conduct this visit, patient states they are participating from their home address. Some vital signs may be absent or patient reported.   Patient identification: identified by name, DOB, and current address.    Review of Systems:   Cardiac Risk Factors include: advanced age (>29men, >65 women);dyslipidemia;hypertension;male gender     Objective:    Vitals: There were no vitals taken for this visit.  There is no height or weight on file to calculate BMI.  Advanced Directives 09/25/2019 09/13/2018 09/01/2017 05/23/2017 09/05/2016  Does Patient Have a Medical Advance Directive? No No No Yes;No No  Would patient like information on creating a medical advance directive? - Yes (MAU/Ambulatory/Procedural Areas - Information given) Yes (MAU/Ambulatory/Procedural Areas - Information given) - No - Patient declined    Tobacco Social History   Tobacco Use  Smoking Status Never Smoker  Smokeless Tobacco Never Used     Counseling given: Not Answered   Clinical Intake:  Pre-visit preparation completed: Yes  Pain : 0-10 Pain Score: 3  Pain Type: Acute pain Pain Location: Knee Pain Orientation: Right Pain Descriptors / Indicators: Aching Pain Onset: In the past 7 days     Nutritional Status: BMI > 30  Obese Nutritional Risks: None Diabetes: No  How often do you need to have someone help you when you read instructions, pamphlets, or other written materials from your doctor or pharmacy?: 1 - Never  Interpreter Needed?: No  Information entered by :: Gerold Sar,LPN  Past Medical History:  Diagnosis Date  . Diabetes mellitus without complication ([place])   . Hyperlipidemia   . Hypertension     Past Surgical History:  Procedure Laterality Date  . APPENDECTOMY    . COLONOSCOPY WITH PROPOFOL N/A 09/05/2016   Procedure: COLONOSCOPY WITH PROPOFOL;  Surgeon: Lucilla Lame, MD;  Location: [facility];  Service: Endoscopy;  Laterality: N/A;  Diabetic - oral meds  . POLYPECTOMY  09/05/2016   Procedure: POLYPECTOMY;  Surgeon: Lucilla Lame, MD;  Location: [facility];  Service: Endoscopy;;  . TONSILLECTOMY     Family History  Problem Relation Age of Onset  . Cancer Mother   . Heart attack Father 66  . Heart disease Brother    Social History   Socioeconomic History  . Marital status: Married    Spouse name: Not on file  . Number of children: Not on file  . Years of education: Not on file  . Highest education level: Not on file  Occupational History  . Not on file  Tobacco Use  . Smoking status: Never Smoker  . Smokeless tobacco: Never Used  Substance and Sexual Activity  . Alcohol use: No  . Drug use: No  . Sexual activity: Not on file  Other Topics Concern  . Not on file  Social History Narrative  . Not on file   Social Determinants of Health   Financial Resource Strain:   . Difficulty of Paying Living Expenses: Not on file  Food Insecurity:   . Worried About Charity fundraiser in the Last Year: Not on file  . Ran Out of Food in the Last Year: Not on file  Transportation Needs:   . Lack  of Transportation (Medical): Not on file  . Lack of Transportation (Non-Medical): Not on file  Physical Activity:   . Days of Exercise per Week: Not on file  . Minutes of Exercise per Session: Not on file  Stress:   . Feeling of Stress : Not on file  Social Connections:   . Frequency of Communication with Friends and Family: Not on file  . Frequency of Social Gatherings with Friends and Family: Not on file  . Attends Religious Services: Not on file  . Active Member of Clubs or Organizations: Not on file  . Attends Archivist Meetings: Not on file  .  Marital Status: Not on file    Outpatient Encounter Medications as of 09/25/2019  Medication Sig  . benazepril (LOTENSIN) 40 MG tablet Take 1 tablet (40 mg total) by mouth daily.  Marland Kitchen glucosamine-chondroitin (GLUCOSAMINE-CHONDROITIN DS) 500-400 MG tablet Take by mouth.  Marland Kitchen glucose blood (ONE TOUCH ULTRA TEST) test strip USE TO CHECK BLOOD GLUCOSE TWICE DAILY  . metFORMIN (GLUCOPHAGE) 1000 MG tablet Take 1 tablet (1,000 mg total) by mouth 2 (two) times daily with a meal.  . simvastatin (ZOCOR) 20 MG tablet Take 1 tablet (20 mg total) by mouth at bedtime.   No facility-administered encounter medications on file as of 09/25/2019.    Activities of Daily Living In your present state of health, do you have any difficulty performing the following activities: 09/25/2019  Hearing? N  Comment no hearing aids  Vision? N  Comment eyeglasses, goes to dr.shade  Difficulty concentrating or making decisions? N  Walking or climbing stairs? N  Dressing or bathing? N  Doing errands, shopping? N  Preparing Food and eating ? N  Using the Toilet? N  In the past six months, have you accidently leaked urine? N  Do you have problems with loss of bowel control? N  Managing your Medications? N  Managing your Finances? N  Housekeeping or managing your Housekeeping? N  Some recent data might be hidden    Patient Care Team: Guadalupe Maple, MD as PCP - General (Family Medicine) Lucilla Lame, MD as Consulting Physician (Gastroenterology)   Assessment:   This is a routine wellness examination for [name].  Exercise Activities and Dietary recommendations Current Exercise Habits: Home exercise routine, Type of exercise: walking, Time (Minutes): > 60(5.9 miles), Frequency (Times/Week): 7, Weekly Exercise (Minutes/Week): 0, Intensity: Mild, Exercise limited by: None identified  Goals    . DIET - INCREASE WATER INTAKE     Recommend drinking at least 5-6 glasses of water a day        Fall Risk: Fall Risk   09/25/2019 09/13/2018 04/11/2018 12/27/2017 09/05/2017  Falls in the past year? 0 0 No No No  Number falls in past yr: 0 0 - - -  Injury with Fall? 0 0 - - -    FALL RISK PREVENTION PERTAINING TO THE HOME:  Any stairs in or around the home? Yes  If so, are there any without handrails? No   Home free of loose throw rugs in walkways, pet beds, electrical cords, etc? Yes  Adequate lighting in your home to reduce risk of falls? Yes   ASSISTIVE DEVICES UTILIZED TO PREVENT FALLS:  Life alert? No  Use of a cane, walker or w/c? No  Grab bars in the bathroom? No  Shower chair or bench in shower? No  Elevated toilet seat or a handicapped toilet? No   TIMED UP AND GO:  Unable to  perform   Depression Screen PHQ 2/9 Scores 09/25/2019 09/13/2018 04/11/2018 09/05/2017  PHQ - 2 Score 0 0 0 0  PHQ- 9 Score - - - -    Cognitive Function     6CIT Screen 09/13/2018 09/01/2017  What Year? 0 points 0 points  What month? 0 points 0 points  What time? 0 points 0 points  Count back from 20 0 points 0 points  Months in reverse 0 points 0 points  Repeat phrase 2 points 0 points  Total Score 2 0    Immunization History  Administered Date(s) Administered  . Influenza, High Dose Seasonal PF 08/01/2016, 09/01/2017, 09/13/2018  . Influenza,inj,quad, With Preservative 06/26/2019  . Influenza-Unspecified 08/17/2015  . Pneumococcal Conjugate-13 05/15/2014  . Pneumococcal Polysaccharide-23 04/13/2013  . Pneumococcal-Unspecified 03/21/2007, 04/10/2013  . Tdap 05/15/2014  . Zoster 04/15/2013  . Zoster Recombinat (Shingrix) 11/23/2018, 12/25/2018, 04/01/2019    Qualifies for Shingles Vaccine? Shingrix completed   Tdap: up to date   Flu Vaccine: up to date   Pneumococcal Vaccine: up to date    Screening Tests Health Maintenance  Topic Date Due  . FOOT EXAM  02/08/2018  . HEMOGLOBIN A1C  10/12/2018  . COLONOSCOPY  09/15/2019  . OPHTHALMOLOGY EXAM  08/27/2020  . TETANUS/TDAP  05/15/2024  .  INFLUENZA VACCINE  Completed  . Hepatitis C Screening  Completed  . PNA vac Low Risk Adult  Completed   Cancer Screenings:  Colorectal Screening: Com[facility]/16/2019 [facility]. Repeat every 1 year   Lung Cancer Screening: (Low Dose CT Chest recommended if Age 71-80 years, 30 pack-year currently smoking OR have quit w/in 15years.) does not qualify.   Additional Screening:  Hepatitis C Screening: does qualify; Completed 12/2015  Vision Screening: Recommended annual ophthalmology exams for early detection of glaucoma and other disorders of the eye. Is the patient up to date with their annual eye exam?  Yes  Who is the provider or what is the name of the office in which the pt attends annual eye exams? Dr.Shade  Dental Screening: Recommended annual dental exams for proper oral hygiene  Community Resource Referral:  CRR required this visit?  No        Plan:  I have personally reviewed and addressed the Medicare Annual Wellness questionnaire and have noted the following in the patient's chart:  A. Medical and social history B. Use of alcohol, tobacco or illicit drugs  C. Current medications and supplements D. Functional ability and status E.  Nutritional status F.  Physical activity G. Advance directives H. List of other physicians I.  Hospitalizations, surgeries, and ER visits in previous 12 months J.  [place] such as hearing and vision if needed, cognitive and depression L. Referrals and appointments   In addition, I have reviewed and discussed with patient certain preventive protocols, quality metrics, and best practice recommendations. A written personalized care plan for preventive services as well as general preventive health recommendations were provided to patient.   Signed,   Bevelyn Ngo, LPN  [id number] Nurse Health Advisor   Nurse Notes: due for diabetic foot exam.

## 2019-09-25 NOTE — Patient Instructions (Signed)
[facility] , Thank you for taking time to come for your Medicare Wellness Visit. I appreciate your ongoing commitment to your health goals. Please review the following plan we discussed and let me know if I can assist you in the future.   Screening recommendations/referrals: Colonoscopy: up to date  Recommended yearly ophthalmology/optometry visit for glaucoma screening and checkup Recommended yearly dental visit for hygiene and checkup  Vaccinations: Influenza vaccine: up to date Pneumococcal vaccine: up to date Tdap vaccine: up to date  Shingles vaccine: up to date     Advanced directives: Advance directive discussed with you today.Once this is complete please bring a copy in to our office so we can scan it into your chart.  Conditions/risks identified: diabetic   Next appointment: follow up in one year for your annual wellness visit   Preventive Care 36 Years and Older, Male Preventive care refers to lifestyle choices and visits with your health care provider that can promote health and wellness. What does preventive care include?  A yearly physical exam. This is also called an annual well check.  Dental exams once or twice a year.  Routine eye exams. Ask your health care provider how often you should have your eyes checked.  Personal lifestyle choices, including:  Daily care of your teeth and gums.  Regular physical activity.  Eating a healthy diet.  Avoiding tobacco and drug use.  Limiting alcohol use.  Practicing safe sex.  Taking low doses of aspirin every day.  Taking vitamin and mineral supplements as recommended by your health care provider. What happens during an annual well check? The services and screenings done by your health care provider during your annual well check will depend on your age, overall health, lifestyle risk factors, and family history of disease. Counseling  Your health care provider may ask you questions about your:  Alcohol  use.  Tobacco use.  Drug use.  Emotional well-being.  Home and relationship well-being.  Sexual activity.  Eating habits.  History of falls.  Memory and ability to understand (cognition).  Work and work Statistician. Screening  You may have the following tests or measurements:  Height, weight, and BMI.  Blood pressure.  Lipid and cholesterol levels. These may be checked every 5 years, or more frequently if you are over 18 years old.  Skin check.  Lung cancer screening. You may have this screening every year starting at age 15 if you have a 30-pack-year history of smoking and currently smoke or have quit within the past 15 years.  Fecal occult blood test (FOBT) of the stool. You may have this test every year starting at age 13.  Flexible sigmoidoscopy or colonoscopy. You may have a sigmoidoscopy every 5 years or a colonoscopy every 10 years starting at age 39.  Prostate cancer screening. Recommendations will vary depending on your family history and other risks.  Hepatitis C blood test.  Hepatitis B blood test.  Sexually transmitted disease (STD) testing.  Diabetes screening. This is done by checking your blood sugar (glucose) after you have not eaten for a while (fasting). You may have this done every 1-3 years.  Abdominal aortic aneurysm (AAA) screening. You may need this if you are a current or former smoker.  Osteoporosis. You may be screened starting at age 17 if you are at high risk. Talk with your health care provider about your test results, treatment options, and if necessary, the need for more tests. Vaccines  Your health care provider may recommend  certain vaccines, such as:  Influenza vaccine. This is recommended every year.  Tetanus, diphtheria, and acellular pertussis (Tdap, Td) vaccine. You may need a Td booster every 10 years.  Zoster vaccine. You may need this after age 84.  Pneumococcal 13-valent conjugate (PCV13) vaccine. One dose is  recommended after age 109.  Pneumococcal polysaccharide (PPSV23) vaccine. One dose is recommended after age 11. Talk to your health care provider about which screenings and vaccines you need and how often you need them. This information is not intended to replace advice given to you by your health care provider. Make sure you discuss any questions you have with your health care provider. Document Released: 10/23/2015 Document Revised: 06/15/2016 Document Reviewed: 07/28/2015 Elsevier Interactive Patient Education  2017 [place] Prevention in the Home Falls can cause injuries. They can happen to people of all ages. There are many things you can do to make your home safe and to help prevent falls. What can I do on the outside of my home?  Regularly fix the edges of walkways and driveways and fix any cracks.  Remove anything that might make you trip as you walk through a door, such as a raised step or threshold.  Trim any bushes or trees on the path to your home.  Use bright outdoor lighting.  Clear any walking paths of anything that might make someone trip, such as rocks or tools.  Regularly check to see if handrails are loose or broken. Make sure that both sides of any steps have handrails.  Any raised decks and porches should have guardrails on the edges.  Have any leaves, snow, or ice cleared regularly.  Use sand or salt on walking paths during winter.  Clean up any spills in your garage right away. This includes oil or grease spills. What can I do in the bathroom?  Use night lights.  Install grab bars by the toilet and in the tub and shower. Do not use towel bars as grab bars.  Use non-skid mats or decals in the tub or shower.  If you need to sit down in the shower, use a plastic, non-slip stool.  Keep the floor dry. Clean up any water that spills on the floor as soon as it happens.  Remove soap buildup in the tub or shower regularly.  Attach bath mats  securely with double-sided non-slip rug tape.  Do not have throw rugs and other things on the floor that can make you trip. What can I do in the bedroom?  Use night lights.  Make sure that you have a light by your bed that is easy to reach.  Do not use any sheets or blankets that are too big for your bed. They should not hang down onto the floor.  Have a firm chair that has side arms. You can use this for support while you get dressed.  Do not have throw rugs and other things on the floor that can make you trip. What can I do in the kitchen?  Clean up any spills right away.  Avoid walking on wet floors.  Keep items that you use a lot in easy-to-reach places.  If you need to reach something above you, use a strong step stool that has a grab bar.  Keep electrical cords out of the way.  Do not use floor polish or wax that makes floors slippery. If you must use wax, use non-skid floor wax.  Do not have throw rugs and other  things on the floor that can make you trip. What can I do with my stairs?  Do not leave any items on the stairs.  Make sure that there are handrails on both sides of the stairs and use them. Fix handrails that are broken or loose. Make sure that handrails are as long as the stairways.  Check any carpeting to make sure that it is firmly attached to the stairs. Fix any carpet that is loose or worn.  Avoid having throw rugs at the top or bottom of the stairs. If you do have throw rugs, attach them to the floor with carpet tape.  Make sure that you have a light switch at the top of the stairs and the bottom of the stairs. If you do not have them, ask someone to add them for you. What else can I do to help prevent falls?  Wear shoes that:  Do not have high heels.  Have rubber bottoms.  Are comfortable and fit you well.  Are closed at the toe. Do not wear sandals.  If you use a stepladder:  Make sure that it is fully opened. Do not climb a closed  stepladder.  Make sure that both sides of the stepladder are locked into place.  Ask someone to hold it for you, if possible.  Clearly mark and make sure that you can see:  Any grab bars or handrails.  First and last steps.  Where the edge of each step is.  Use tools that help you move around (mobility aids) if they are needed. These include:  Canes.  Walkers.  Scooters.  Crutches.  Turn on the lights when you go into a dark area. Replace any light bulbs as soon as they burn out.  Set up your furniture so you have a clear path. Avoid moving your furniture around.  If any of your floors are uneven, fix them.  If there are any pets around you, be aware of where they are.  Review your medicines with your doctor. Some medicines can make you feel dizzy. This can increase your chance of falling. Ask your doctor what other things that you can do to help prevent falls. This information is not intended to replace advice given to you by your health care provider. Make sure you discuss any questions you have with your health care provider. Document Released: 07/23/2009 Document Revised: 03/03/2016 Document Reviewed: 10/31/2014 Elsevier Interactive Patient Education  2017 [employer].

## 2019-09-27 ENCOUNTER — Encounter: Payer: Self-pay | Admitting: Family Medicine

## 2019-09-27 ENCOUNTER — Other Ambulatory Visit: Payer: Self-pay

## 2019-09-27 ENCOUNTER — Telehealth: Payer: Self-pay | Admitting: Family Medicine

## 2019-09-27 ENCOUNTER — Ambulatory Visit (INDEPENDENT_AMBULATORY_CARE_PROVIDER_SITE_OTHER): Payer: Medicare Other | Admitting: Family Medicine

## 2019-09-27 VITALS — BP 116/74 | HR 79 | Temp 99.1°F | Ht 67.0 in | Wt 210.0 lb

## 2019-09-27 DIAGNOSIS — R972 Elevated prostate specific antigen [PSA]: Secondary | ICD-10-CM

## 2019-09-27 DIAGNOSIS — E119 Type 2 diabetes mellitus without complications: Secondary | ICD-10-CM

## 2019-09-27 DIAGNOSIS — I10 Essential (primary) hypertension: Secondary | ICD-10-CM

## 2019-09-27 DIAGNOSIS — H6121 Impacted cerumen, right ear: Secondary | ICD-10-CM

## 2019-09-27 DIAGNOSIS — E7800 Pure hypercholesterolemia, unspecified: Secondary | ICD-10-CM | POA: Diagnosis not present

## 2019-09-27 LAB — BAYER DCA HB A1C WAIVED: HB A1C (BAYER DCA - WAIVED): 7.1 % — ABNORMAL HIGH (ref <7.0)

## 2019-09-27 LAB — UA/M W/RFLX CULTURE, ROUTINE
Bilirubin, UA: NEGATIVE
Glucose, UA: NEGATIVE
Ketones, UA: NEGATIVE
Leukocytes,UA: NEGATIVE
Nitrite, UA: NEGATIVE
Protein,UA: NEGATIVE
RBC, UA: NEGATIVE
Specific Gravity, UA: 1.03 (ref 1.005–1.030)
Urobilinogen, Ur: 0.2 mg/dL (ref 0.2–1.0)
pH, UA: 5.5 (ref 5.0–7.5)

## 2019-09-27 LAB — MICROALBUMIN, URINE WAIVED
Creatinine, Urine Waived: 300 mg/dL (ref 10–300)
Microalb, Ur Waived: 80 mg/L — ABNORMAL HIGH (ref 0–19)
Microalb/Creat Ratio: <30 mg/g (ref <30)

## 2019-09-27 MED ORDER — METFORMIN HCL ER 500 MG PO TB24: 1000.0000 mg | ORAL_TABLET | Freq: Two times a day (BID) | ORAL | 1 refills | Status: DC | PRN

## 2019-09-27 MED ORDER — GLUCOSE BLOOD VI STRP: ORAL_STRIP | 12 refills | Status: DC

## 2019-09-27 MED ORDER — SIMVASTATIN 20 MG PO TABS: 20.0000 mg | ORAL_TABLET | Freq: Every day | ORAL | 1 refills | Status: DC

## 2019-09-27 MED ORDER — METFORMIN HCL ER 500 MG PO TB24: 1000.0000 mg | ORAL_TABLET | Freq: Every day | ORAL | 1 refills | Status: DC

## 2019-09-27 MED ORDER — BENAZEPRIL HCL 40 MG PO TABS: 40.0000 mg | ORAL_TABLET | Freq: Every day | ORAL | 1 refills | Status: DC

## 2019-09-27 NOTE — Telephone Encounter (Signed)
Copied from [place] [phone]. Topic: General - Other >> Sep 27, 2019 12:33 PM Mcneil, Ja-Kwan wrote: Reason for CRM: Pt stated that he was advised by his pharmacy that they could only dispense 180 tablets of the metformin. Pt stated it is probably because the instructions state:  Take 2 tablets (1,000 mg total) by mouth daily with breakfast. Pt requests that the Rx be resubmitted to his pharmacy to reflect 360 tablets 2 tablets with breakfast and 2 tablets at night.

## 2019-09-27 NOTE — Assessment & Plan Note (Signed)
Rechecking labs today. Await results. Will refer to urology. Call with any concerns.

## 2019-09-27 NOTE — Assessment & Plan Note (Signed)
Under good control on current regimen. Continue current regimen. Continue to monitor. Call with any concerns. Refills given. Labs drawn today.   

## 2019-09-27 NOTE — Assessment & Plan Note (Signed)
A1c stable at 7.1- continue current regimen. Continue to monitor. Call with any concerns.

## 2019-09-27 NOTE — Progress Notes (Signed)
BP 116/74 (BP Location: Left Arm, Patient Position: Sitting, Cuff Size: Normal)   Pulse 79   Temp 99.1 F (37.3 C) (Oral)   Ht 5' 7" (1.702 m)   Wt 210 lb (95.3 kg)   SpO2 96%   BMI 32.89 kg/m    Subjective:    Patient ID: Ralph Chapman, male    DOB: [birth year], 73 y.o.   MRN: [id number]  HPI: Ralph Chapman is a 73 y.o. male  Chief Complaint  Patient presents with  . Ear Check    Patient states that his ears have been popping, especially the right.  . Diabetes  . Hypertension  . Hyperlipidemia   Hurt his knee about a week ago. Wearing a knee brace.  EAG CLOGGED Duration: days Involved ear(s):  "right Sensation of feeling clogged/plugged: yes Decreased/muffled hearing:no Ear pain: no Fever: no Otorrhea: no Hearing loss: no Upper respiratory infection symptoms: no Using Q-Tips: yes Status: fluctuating History of cerumenosis: yes Treatments attempted: none  HYPERTENSION / HYPERLIPIDEMIA Satisfied with current treatment? yes Duration of hypertension: chronic BP monitoring frequency: not checking BP medication side effects: no Past BP meds: benazepril Duration of hyperlipidemia: chronic Cholesterol medication side effects: no Cholesterol supplements: none Past cholesterol medications: simvastatin Medication compliance: excellent compliance Aspirin: no Recent stressors: no Recurrent headaches: no Visual changes: no Palpitations: no Dyspnea: no Chest pain: no Lower extremity edema: no Dizzy/lightheaded: no  DIABETES Hypoglycemic episodes:no Polydipsia/polyuria: no Visual disturbance: no Chest pain: no Paresthesias: no Glucose Monitoring: no  Accucheck frequency: Not Checking Taking Insulin?: no Blood Pressure Monitoring: not checking Retinal Examination: Up to Date Foot Exam: Up to date Diabetic Education: Completed Pneumovax: Up to Date Influenza: Up to Date Aspirin: yes  Relevant past medical, surgical, family and social history  reviewed and updated as indicated. Interim medical history since our last visit reviewed. Allergies and medications reviewed and updated.  Review of Systems  Constitutional: Negative.   HENT: Positive for ear discharge. Negative for congestion, dental problem, drooling, ear pain, facial swelling, hearing loss, mouth sores, nosebleeds, postnasal drip, rhinorrhea, sinus pressure, sinus pain, sneezing, sore throat, tinnitus, trouble swallowing and voice change.   Respiratory: Negative.   Cardiovascular: Negative.   Musculoskeletal: Positive for arthralgias. Negative for back pain, gait problem, joint swelling, myalgias, neck pain and neck stiffness.  Skin: Negative.   Psychiatric/Behavioral: Negative.     Per HPI unless specifically indicated above     Objective:    BP 116/74 (BP Location: Left Arm, Patient Position: Sitting, Cuff Size: Normal)   Pulse 79   Temp 99.1 F (37.3 C) (Oral)   Ht 5' 7" (1.702 m)   Wt 210 lb (95.3 kg)   SpO2 96%   BMI 32.89 kg/m   Wt Readings from Last 3 Encounters:  09/27/19 210 lb (95.3 kg)  09/13/18 213 lb (96.6 kg)  04/11/18 216 lb (98 kg)    Physical Exam Vitals and nursing note reviewed.  Constitutional:      General: He is not in acute distress.    Appearance: Normal appearance. He is not ill-appearing, toxic-appearing or diaphoretic.  HENT:     Head: Normocephalic and atraumatic.     Right Ear: External ear normal.     Left Ear: External ear normal.     Nose: Nose normal.     Mouth/Throat:     Mouth: Mucous membranes are moist.     Pharynx: Oropharynx is clear.  Eyes:     General: No scleral icterus.  Right eye: No discharge.        Left eye: No discharge.     Extraocular Movements: Extraocular movements intact.     Conjunctiva/sclera: Conjunctivae normal.     Pupils: Pupils are equal, round, and reactive to light.  Cardiovascular:     Rate and Rhythm: Normal rate and regular rhythm.     Pulses: Normal pulses.     Heart  sounds: Normal heart sounds. No murmur. No friction rub. No gallop.   Pulmonary:     Effort: Pulmonary effort is normal. No respiratory distress.     Breath sounds: Normal breath sounds. No stridor. No wheezing, rhonchi or rales.  Chest:     Chest wall: No tenderness.  Musculoskeletal:        General: Normal range of motion.     Cervical back: Normal range of motion and neck supple.  Skin:    General: Skin is warm and dry.     Capillary Refill: Capillary refill takes less than 2 seconds.     Coloration: Skin is not jaundiced or pale.     Findings: No bruising, erythema, lesion or rash.  Neurological:     General: No focal deficit present.     Mental Status: He is alert and oriented to person, place, and time. Mental status is at baseline.  Psychiatric:        Mood and Affect: Mood normal.        Behavior: Behavior normal.        Thought Content: Thought content normal.        Judgment: Judgment normal.     Results for orders placed or performed in visit on 09/27/19  Bayer DCA Hb A1c Waived  Result Value Ref Range   HB A1C (BAYER DCA - WAIVED) 7.1 (H) <7.0 %  CBC with Differential OUT  Result Value Ref Range   WBC 4.8 3.4 - 10.8 x10E3/uL   RBC 4.32 4.14 - 5.80 x10E6/uL   Hemoglobin 13.8 13.0 - 17.7 g/dL   Hematocrit 41.6 37.5 - 51.0 %   MCV 96 79 - 97 fL   MCH 31.9 26.6 - 33.0 pg   MCHC 33.2 31.5 - 35.7 g/dL   RDW 12.3 11.6 - 15.4 %   Platelets 301 150 - 450 x10E3/uL   Neutrophils 57 Not Estab. %   Lymphs 24 Not Estab. %   Monocytes 10 Not Estab. %   Eos 6 Not Estab. %   Basos 3 Not Estab. %   Neutrophils Absolute 2.7 1.4 - 7.0 x10E3/uL   Lymphocytes Absolute 1.2 0.7 - 3.1 x10E3/uL   Monocytes Absolute 0.5 0.1 - 0.9 x10E3/uL   EOS (ABSOLUTE) 0.3 0.0 - 0.4 x10E3/uL   Basophils Absolute 0.1 0.0 - 0.2 x10E3/uL   Immature Granulocytes 0 Not Estab. %   Immature Grans (Abs) 0.0 0.0 - 0.1 x10E3/uL  Comp Met (CMET)  Result Value Ref Range   Glucose 126 (H) 65 - 99 mg/dL     BUN 15 8 - 27 mg/dL   Creatinine, Ser 1.02 0.76 - 1.27 mg/dL   GFR calc non Af Amer 74 >59 mL/min/1.73   GFR calc Af Amer 85 >59 mL/min/1.73   BUN/Creatinine Ratio 15 10 - 24   Sodium 139 134 - 144 mmol/L   Potassium 5.2 3.5 - 5.2 mmol/L   Chloride 103 96 - 106 mmol/L   CO2 21 20 - 29 mmol/L   Calcium 9.4 8.6 - 10.2 mg/dL   Total Protein 6.9  6.0 - 8.5 g/dL   Albumin 4.5 3.7 - 4.7 g/dL   Globulin, Total 2.4 1.5 - 4.5 g/dL   Albumin/Globulin Ratio 1.9 1.2 - 2.2   Bilirubin Total 0.6 0.0 - 1.2 mg/dL   Alkaline Phosphatase 74 39 - 117 IU/L   AST 20 0 - 40 IU/L   ALT 13 0 - 44 IU/L  Lipid Panel w/o Chol/HDL Ratio OUT  Result Value Ref Range   Cholesterol, Total 164 100 - 199 mg/dL   Triglycerides 173 (H) 0 - 149 mg/dL   HDL 45 >39 mg/dL   VLDL Cholesterol Cal 30 5 - 40 mg/dL   LDL Chol Calc (NIH) 89 0 - 99 mg/dL  Microalbumin, Urine Waived  Result Value Ref Range   Microalb, Ur Waived 80 (H) 0 - 19 mg/L   Creatinine, Urine Waived 300 10 - 300 mg/dL   Microalb/Creat Ratio <30 <30 mg/g  PSA  Result Value Ref Range   Prostate Specific Ag, Serum 4.3 (H) 0.0 - 4.0 ng/mL  TSH  Result Value Ref Range   TSH 1.850 0.450 - 4.500 uIU/mL  UA/M w/rflx Culture, Routine   Specimen: Urine   URINE  Result Value Ref Range   Specific Gravity, UA 1.030 1.005 - 1.030   pH, UA 5.5 5.0 - 7.5   Color, UA Yellow Yellow   Appearance Ur Clear Clear   Leukocytes,UA Negative Negative   Protein,UA Negative Negative/Trace   Glucose, UA Negative Negative   Ketones, UA Negative Negative   RBC, UA Negative Negative   Bilirubin, UA Negative Negative   Urobilinogen, Ur 0.2 0.2 - 1.0 mg/dL   Nitrite, UA Negative Negative      Assessment & Plan:   Problem List Items Addressed This Visit      Cardiovascular and Mediastinum   Hypertension - Primary    Under good control on current regimen. Continue current regimen. Continue to monitor. Call with any concerns. Refills given. Labs drawn today.        Relevant Medications   simvastatin (ZOCOR) 20 MG tablet   benazepril (LOTENSIN) 40 MG tablet   Other Relevant Orders   CBC with Differential OUT (Completed)   Comp Met (CMET) (Completed)   Microalbumin, Urine Waived (Completed)   TSH (Completed)     Endocrine   Diabetes mellitus without complication (HCC)    X6I stable at 7.1- continue current regimen. Continue to monitor. Call with any concerns.       Relevant Medications   simvastatin (ZOCOR) 20 MG tablet   benazepril (LOTENSIN) 40 MG tablet   glucose blood (ONE TOUCH ULTRA TEST) test strip   Other Relevant Orders   Bayer DCA Hb A1c Waived (Completed)   CBC with Differential OUT (Completed)   Comp Met (CMET) (Completed)   Microalbumin, Urine Waived (Completed)   UA/M w/rflx Culture, Routine (Completed)     Other   Hyperlipidemia    Under good control on current regimen. Continue current regimen. Continue to monitor. Call with any concerns. Refills given. Labs drawn today.      Relevant Medications   simvastatin (ZOCOR) 20 MG tablet   benazepril (LOTENSIN) 40 MG tablet   Other Relevant Orders   CBC with Differential OUT (Completed)   Comp Met (CMET) (Completed)   Lipid Panel w/o Chol/HDL Ratio OUT (Completed)   Elevated prostate specific antigen (PSA)    Rechecking labs today. Await results. Will refer to urology. Call with any concerns.       Relevant  Orders   CBC with Differential OUT (Completed)   Comp Met (CMET) (Completed)   PSA (Completed)   Ambulatory referral to Urology    Other Visit Diagnoses    Impacted cerumen of right ear       Ear flushed today with fair results. Will start on debrox. Call with any concerns.        Follow up plan: Return in about 3 months (around 12/26/2019).

## 2019-09-28 ENCOUNTER — Encounter: Payer: Self-pay | Admitting: Family Medicine

## 2019-09-28 LAB — CBC WITH DIFFERENTIAL/PLATELET
Basophils Absolute: 0.1 10*3/uL (ref 0.0–0.2)
Basos: 3 %
EOS (ABSOLUTE): 0.3 10*3/uL (ref 0.0–0.4)
Eos: 6 %
Hematocrit: 41.6 % (ref 37.5–51.0)
Hemoglobin: 13.8 g/dL (ref 13.0–17.7)
Immature Grans (Abs): 0 10*3/uL (ref 0.0–0.1)
Immature Granulocytes: 0 %
Lymphocytes Absolute: 1.2 10*3/uL (ref 0.7–3.1)
Lymphs: 24 %
MCH: 31.9 pg (ref 26.6–33.0)
MCHC: 33.2 g/dL (ref 31.5–35.7)
MCV: 96 fL (ref 79–97)
Monocytes Absolute: 0.5 10*3/uL (ref 0.1–0.9)
Monocytes: 10 %
Neutrophils Absolute: 2.7 10*3/uL (ref 1.4–7.0)
Neutrophils: 57 %
Platelets: 301 10*3/uL (ref 150–450)
RBC: 4.32 x10E6/uL (ref 4.14–5.80)
RDW: 12.3 % (ref 11.6–15.4)
WBC: 4.8 10*3/uL (ref 3.4–10.8)

## 2019-09-28 LAB — COMPREHENSIVE METABOLIC PANEL
ALT: 13 IU/L (ref 0–44)
AST: 20 IU/L (ref 0–40)
Albumin/Globulin Ratio: 1.9 (ref 1.2–2.2)
Albumin: 4.5 g/dL (ref 3.7–4.7)
Alkaline Phosphatase: 74 IU/L (ref 39–117)
BUN/Creatinine Ratio: 15 (ref 10–24)
BUN: 15 mg/dL (ref 8–27)
Bilirubin Total: 0.6 mg/dL (ref 0.0–1.2)
CO2: 21 mmol/L (ref 20–29)
Calcium: 9.4 mg/dL (ref 8.6–10.2)
Chloride: 103 mmol/L (ref 96–106)
Creatinine, Ser: 1.02 mg/dL (ref 0.76–1.27)
GFR calc Af Amer: 85 mL/min/{1.73_m2} (ref >59)
GFR calc non Af Amer: 74 mL/min/{1.73_m2} (ref >59)
Globulin, Total: 2.4 g/dL (ref 1.5–4.5)
Glucose: 126 mg/dL — ABNORMAL HIGH (ref 65–99)
Potassium: 5.2 mmol/L (ref 3.5–5.2)
Sodium: 139 mmol/L (ref 134–144)
Total Protein: 6.9 g/dL (ref 6.0–8.5)

## 2019-09-28 LAB — LIPID PANEL W/O CHOL/HDL RATIO
Cholesterol, Total: 164 mg/dL (ref 100–199)
HDL: 45 mg/dL (ref >39)
LDL Chol Calc (NIH): 89 mg/dL (ref 0–99)
Triglycerides: 173 mg/dL — ABNORMAL HIGH (ref 0–149)
VLDL Cholesterol Cal: 30 mg/dL (ref 5–40)

## 2019-09-28 LAB — TSH: TSH: 1.85 u[IU]/mL (ref 0.450–4.500)

## 2019-09-28 LAB — PSA: Prostate Specific Ag, Serum: 4.3 ng/mL — ABNORMAL HIGH (ref 0.0–4.0)

## 2019-11-06 ENCOUNTER — Encounter: Payer: Self-pay | Admitting: Urology

## 2019-11-06 ENCOUNTER — Ambulatory Visit: Payer: Medicare PPO | Admitting: Urology

## 2019-11-06 ENCOUNTER — Other Ambulatory Visit: Payer: Self-pay

## 2019-11-06 VITALS — BP 120/77 | HR 86 | Ht 67.0 in | Wt 210.0 lb

## 2019-11-06 DIAGNOSIS — R972 Elevated prostate specific antigen [PSA]: Secondary | ICD-10-CM

## 2019-11-06 NOTE — Progress Notes (Signed)
11/06/2019 8:46 AM   Ivor Costa 11/25/1945 [id number]  Referring provider: Valerie Roys, DO [place],  [place] [address]  Chief Complaint  Patient presents with  . Benign Prostatic Hypertrophy    HPI: Ralph Chapman is a 74 y.o. male seen at the request of Dr. Wynetta Emery for evaluation of an elevated PSA.  He has a history of an elevated PSA dating back to 2016.    Review of available PSA results as follows: 9/16  4.2 10/17  4.0 11/18  4.4 3/19  6.2 6/19  4.84 12/20  4.3  With his PSA bumped to 6.2 in March 2019 he was referred to Dr. Jacqlyn Larsen at [facility].  A follow-up PSA was 4.84 and surveillance was recommended.  His most recent PSA December 2020 was 4.3.  He has mild lower urinary tract symptoms of occasional decreased stream and nocturia x1.  IPSS was 2/35 with a QoL rated 1/6.  Denies dysuria or gross hematuria.  He has no flank, abdominal or pelvic pain.   PMH: Past Medical History:  Diagnosis Date  . Diabetes mellitus without complication ([place])   . Hyperlipidemia   . Hypertension     Surgical History: Past Surgical History:  Procedure Laterality Date  . APPENDECTOMY    . COLONOSCOPY WITH PROPOFOL N/A 09/05/2016   Procedure: COLONOSCOPY WITH PROPOFOL;  Surgeon: Lucilla Lame, MD;  Location: [facility];  Service: Endoscopy;  Laterality: N/A;  Diabetic - oral meds  . POLYPECTOMY  09/05/2016   Procedure: POLYPECTOMY;  Surgeon: Lucilla Lame, MD;  Location: [facility];  Service: Endoscopy;;  . TONSILLECTOMY      Home Medications:  Allergies as of 11/06/2019      Reactions   Aspirin Hives, Shortness Of Breath, Swelling   Lips swell   Niacin And Related Swelling   Tramadol Nausea And Vomiting      Medication List       Accurate as of November 06, 2019 11:59 PM. If you have any questions, ask your nurse or doctor.        benazepril 40 MG tablet Commonly known as: LOTENSIN Take 1 tablet (40 mg total) by mouth daily.     Cholecalciferol 125 MCG (5000 UT) Tabs Take by mouth.   diclofenac Sodium 1 % Gel Commonly known as: VOLTAREN Apply topically.   Glucosamine-Chondroitin DS 500-400 MG tablet Generic drug: glucosamine-chondroitin Take by mouth.   glucose blood test strip Commonly known as: ONE TOUCH ULTRA TEST USE TO CHECK BLOOD GLUCOSE TWICE DAILY   metFORMIN 500 MG 24 hr tablet Commonly known as: GLUCOPHAGE-XR Take 2 tablets (1,000 mg total) by mouth 2 (two) times daily as needed.   simvastatin 20 MG tablet Commonly known as: ZOCOR Take 1 tablet (20 mg total) by mouth at bedtime.       Allergies:  Allergies  Allergen Reactions  . Aspirin Hives, Shortness Of Breath and Swelling    Lips swell  . Niacin And Related Swelling  . Tramadol Nausea And Vomiting    Family History: Family History  Problem Relation Age of Onset  . Cancer Mother   . Heart attack Father 87  . Heart disease Brother     Social History:  reports that he has never smoked. He has never used smokeless tobacco. He reports that he does not drink alcohol or use drugs.  ROS: UROLOGY Frequent Urination?: No Hard to postpone urination?: No Burning/pain with urination?: No Get up at night to urinate?:  No Leakage of urine?: No Urine stream starts and stops?: No Trouble starting stream?: No Do you have to strain to urinate?: No Blood in urine?: No Urinary tract infection?: No Sexually transmitted disease?: No Injury to kidneys or bladder?: No Painful intercourse?: No Weak stream?: Yes Erection problems?: No Penile pain?: No  Gastrointestinal Nausea?: No Vomiting?: No Indigestion/heartburn?: No Diarrhea?: No Constipation?: No  Constitutional Fever: No Night sweats?: No Weight loss?: No Fatigue?: No  Skin Skin rash/lesions?: No Itching?: No  Eyes Blurred vision?: No Double vision?: No  Ears/Nose/Throat Sore throat?: No Sinus problems?: No  Hematologic/Lymphatic Swollen glands?: No Easy  bruising?: No  Cardiovascular Leg swelling?: No Chest pain?: No  Respiratory Cough?: No Shortness of breath?: No  Endocrine Excessive thirst?: No  Musculoskeletal Back pain?: No Joint pain?: No  Neurological Headaches?: No Dizziness?: No  Psychologic Depression?: No Anxiety?: No  Physical Exam: BP 120/77   Pulse 86   Ht 5\' 7"  (1.702 m)   Wt 210 lb (95.3 kg)   BMI 32.89 kg/m   Constitutional:  Alert and oriented, No acute distress. HEENT: [place] AT, moist mucus membranes.  Trachea midline, no masses. Cardiovascular: No clubbing, cyanosis, or edema. Respiratory: Normal respiratory effort, no increased work of breathing. GU: Prostate 40 g, smooth without nodules Skin: No rashes, bruises or suspicious lesions. Neurologic: Grossly intact, no focal deficits, moving all 4 extremities. Psychiatric: Normal mood and affect.   Assessment & Plan:   74 y.o.male with a mildly elevated PSA by strict criteria and normal PSA by age-specific guidelines.  DRE is benign.  We discussed current prostate cancer screening guidelines and that the incidence of clinically significant prostate cancer after age 49 is not common and based on that data current screening guidelines are recommended between the ages of 71-69.  He is in good health and would recommend continuing annual PSA until age 55 and if his PSA remains stable would discontinue PSA checks.  I offered to see him back annually however he preferred to continue to see Dr. Wynetta Emery for annual PSA and return here for any significant change.   Abbie Sons, [place] [address], [place], [place] [address] [phone]

## 2019-11-07 ENCOUNTER — Encounter: Payer: Self-pay | Admitting: Urology

## 2019-11-15 ENCOUNTER — Ambulatory Visit: Payer: Medicare Other | Attending: Internal Medicine

## 2019-11-15 DIAGNOSIS — Z23 Encounter for immunization: Secondary | ICD-10-CM

## 2019-11-15 NOTE — Progress Notes (Signed)
   Covid-19 Vaccination Clinic  Name:  Ralph Chapman    MRN: [id number] DOB: Dec 17, 1945  11/15/2019  Mr. [name] was observed post Covid-19 immunization for 15 minutes without incidence. He was provided with Vaccine Information Sheet and instruction to access the V-Safe system.   Mr. [name] was instructed to call 911 with any severe reactions post vaccine: Marland Kitchen Difficulty breathing  . Swelling of your face and throat  . A fast heartbeat  . A bad rash all over your body  . Dizziness and weakness    Immunizations Administered    Name Date Dose VIS Date Route   Moderna COVID-19 Vaccine 11/15/2019  2:28 PM 0.5 mL 09/10/2019 Intramuscular   Manufacturer: Moderna   Lot: [id number]   [id number]

## 2019-12-17 ENCOUNTER — Ambulatory Visit: Payer: Medicare Other | Attending: Internal Medicine

## 2019-12-17 DIAGNOSIS — Z23 Encounter for immunization: Secondary | ICD-10-CM | POA: Insufficient documentation

## 2019-12-17 NOTE — Progress Notes (Signed)
   Covid-19 Vaccination Clinic  Name:  Ralph Chapman    MRN: [id number] DOB: [birth year]  12/17/2019  Ralph Chapman was observed post Covid-19 immunization for 15 minutes without incident. He was provided with Vaccine Information Sheet and instruction to access the V-Safe system.   Ralph Chapman was instructed to call 911 with any severe reactions post vaccine: Marland Kitchen Difficulty breathing  . Swelling of face and throat  . A fast heartbeat  . A bad rash all over body  . Dizziness and weakness   Immunizations Administered    Name Date Dose VIS Date Route   Moderna COVID-19 Vaccine 12/17/2019  1:53 PM 0.5 mL 09/10/2019 Intramuscular   Manufacturer: Moderna   Lot: [id number]   [id number]

## 2020-03-24 ENCOUNTER — Encounter: Payer: Self-pay | Admitting: Family Medicine

## 2020-03-24 ENCOUNTER — Other Ambulatory Visit: Payer: Self-pay

## 2020-03-24 ENCOUNTER — Ambulatory Visit (INDEPENDENT_AMBULATORY_CARE_PROVIDER_SITE_OTHER): Payer: Medicare PPO | Admitting: Family Medicine

## 2020-03-24 VITALS — BP 120/84 | HR 68 | Temp 98.4°F | Wt 221.0 lb

## 2020-03-24 DIAGNOSIS — E119 Type 2 diabetes mellitus without complications: Secondary | ICD-10-CM | POA: Diagnosis not present

## 2020-03-24 DIAGNOSIS — I10 Essential (primary) hypertension: Secondary | ICD-10-CM | POA: Diagnosis not present

## 2020-03-24 DIAGNOSIS — N401 Enlarged prostate with lower urinary tract symptoms: Secondary | ICD-10-CM | POA: Diagnosis not present

## 2020-03-24 DIAGNOSIS — E7800 Pure hypercholesterolemia, unspecified: Secondary | ICD-10-CM

## 2020-03-24 DIAGNOSIS — N138 Other obstructive and reflux uropathy: Secondary | ICD-10-CM

## 2020-03-24 DIAGNOSIS — R972 Elevated prostate specific antigen [PSA]: Secondary | ICD-10-CM

## 2020-03-24 DIAGNOSIS — M25561 Pain in right knee: Secondary | ICD-10-CM

## 2020-03-24 LAB — MICROALBUMIN, URINE WAIVED
Creatinine, Urine Waived: 200 mg/dL (ref 10–300)
Microalb, Ur Waived: 30 mg/L — ABNORMAL HIGH (ref 0–19)
Microalb/Creat Ratio: <30 mg/g (ref <30)

## 2020-03-24 LAB — BAYER DCA HB A1C WAIVED: HB A1C (BAYER DCA - WAIVED): 7.4 % — ABNORMAL HIGH (ref <7.0)

## 2020-03-24 MED ORDER — METFORMIN HCL ER 500 MG PO TB24: 1000.0000 mg | ORAL_TABLET | Freq: Two times a day (BID) | ORAL | 1 refills | Status: DC | PRN

## 2020-03-24 MED ORDER — BENAZEPRIL HCL 40 MG PO TABS: 40.0000 mg | ORAL_TABLET | Freq: Every day | ORAL | 1 refills | Status: DC

## 2020-03-24 MED ORDER — SIMVASTATIN 20 MG PO TABS: 20.0000 mg | ORAL_TABLET | Freq: Every day | ORAL | 1 refills | Status: DC

## 2020-03-24 NOTE — Assessment & Plan Note (Signed)
Stable. Labs drawn today. Await results. Call with any concerns.

## 2020-03-24 NOTE — Assessment & Plan Note (Signed)
Stable with A1c of 7.4- will really work on his diet and recheck in 3-6 months. Call with any concerns. Continue to monitor.

## 2020-03-24 NOTE — Assessment & Plan Note (Signed)
Under good control on current regimen. Continue current regimen. Continue to monitor. Call with any concerns. Refills given. Labs drawn today.   

## 2020-03-24 NOTE — Progress Notes (Signed)
BP 120/84   Pulse 68   Temp 98.4 F (36.9 C)   Wt 221 lb (100.2 kg)   SpO2 92%   BMI 34.61 kg/m    Subjective:    Patient ID: Ralph Chapman, male    DOB: 01/08/1946, 74 y.o.   MRN: [id number]  HPI: Ralph Chapman is a 73 y.o. male  Chief Complaint  Patient presents with  . Diabetes  . Hypertension  . Hyperlipidemia   Has been having pain in his R knee for the past couple of months. He twisted funny and heard a pop and it has been bothering him since. Has been using ice. Notes that his pain is bearable, but he's been favoring it a bit. He feels like the knee is getting better.   HYPERTENSION / HYPERLIPIDEMIA Satisfied with current treatment? yes Duration of hypertension: chronic BP monitoring frequency: not checking BP medication side effects: no Past BP meds: benazepril Duration of hyperlipidemia: chronic Cholesterol medication side effects: no Cholesterol supplements: none Past cholesterol medications: simvastatin Medication compliance: excellent compliance Aspirin: no Recent stressors: no Recurrent headaches: no Visual changes: no Palpitations: no Dyspnea: no Chest pain: no Lower extremity edema: no Dizzy/lightheaded: no  DIABETES Hypoglycemic episodes:no Polydipsia/polyuria: no Visual disturbance: no Chest pain: no Paresthesias: no Glucose Monitoring: no  Accucheck frequency: Daily  Fasting glucose: 150s Taking Insulin?: no Blood Pressure Monitoring: not checking Retinal Examination: Up to Date Foot Exam: Up to Date Diabetic Education: Completed Pneumovax: Up to Date Influenza: Up to Date Aspirin: yes   Relevant past medical, surgical, family and social history reviewed and updated as indicated. Interim medical history since our last visit reviewed. Allergies and medications reviewed and updated.  Review of Systems  Constitutional: Negative.   Respiratory: Negative.   Cardiovascular: Negative.   Gastrointestinal: Negative.     Musculoskeletal: Positive for arthralgias. Negative for back pain, gait problem, joint swelling, myalgias, neck pain and neck stiffness.  Skin: Negative.   Neurological: Negative.   Psychiatric/Behavioral: Negative.     Per HPI unless specifically indicated above     Objective:    BP 120/84   Pulse 68   Temp 98.4 F (36.9 C)   Wt 221 lb (100.2 kg)   SpO2 92%   BMI 34.61 kg/m   Wt Readings from Last 3 Encounters:  03/24/20 221 lb (100.2 kg)  11/06/19 210 lb (95.3 kg)  09/27/19 210 lb (95.3 kg)    Physical Exam Vitals and nursing note reviewed.  Constitutional:      General: He is not in acute distress.    Appearance: Normal appearance. He is not ill-appearing, toxic-appearing or diaphoretic.  HENT:     Head: Normocephalic and atraumatic.     Right Ear: External ear normal.     Left Ear: External ear normal.     Nose: Nose normal.     Mouth/Throat:     Mouth: Mucous membranes are moist.     Pharynx: Oropharynx is clear.  Eyes:     General: No scleral icterus.       Right eye: No discharge.        Left eye: No discharge.     Extraocular Movements: Extraocular movements intact.     Conjunctiva/sclera: Conjunctivae normal.     Pupils: Pupils are equal, round, and reactive to light.  Cardiovascular:     Rate and Rhythm: Normal rate and regular rhythm.     Pulses: Normal pulses.     Heart sounds: Normal heart  sounds. No murmur heard.  No friction rub. No gallop.   Pulmonary:     Effort: Pulmonary effort is normal. No respiratory distress.     Breath sounds: Normal breath sounds. No stridor. No wheezing, rhonchi or rales.  Chest:     Chest wall: No tenderness.  Musculoskeletal:        General: Normal range of motion.     Cervical back: Normal range of motion and neck supple.  Skin:    General: Skin is warm and dry.     Capillary Refill: Capillary refill takes less than 2 seconds.     Coloration: Skin is not jaundiced or pale.     Findings: No bruising, erythema,  lesion or rash.  Neurological:     General: No focal deficit present.     Mental Status: He is alert and oriented to person, place, and time. Mental status is at baseline.  Psychiatric:        Mood and Affect: Mood normal.        Behavior: Behavior normal.        Thought Content: Thought content normal.        Judgment: Judgment normal.     Results for orders placed or performed in visit on 09/27/19  Bayer DCA Hb A1c Waived  Result Value Ref Range   HB A1C (BAYER DCA - WAIVED) 7.1 (H) <7.0 %  CBC with Differential OUT  Result Value Ref Range   WBC 4.8 3.4 - 10.8 x10E3/uL   RBC 4.32 4.14 - 5.80 x10E6/uL   Hemoglobin 13.8 13.0 - 17.7 g/dL   Hematocrit 41.6 37.5 - 51.0 %   MCV 96 79 - 97 fL   MCH 31.9 26.6 - 33.0 pg   MCHC 33.2 31 - 35 g/dL   RDW 12.3 11.6 - 15.4 %   Platelets 301 150 - 450 x10E3/uL   Neutrophils 57 Not Estab. %   Lymphs 24 Not Estab. %   Monocytes 10 Not Estab. %   Eos 6 Not Estab. %   Basos 3 Not Estab. %   Neutrophils Absolute 2.7 1 - 7 x10E3/uL   Lymphocytes Absolute 1.2 0 - 3 x10E3/uL   Monocytes Absolute 0.5 0 - 0 x10E3/uL   EOS (ABSOLUTE) 0.3 0.0 - 0.4 x10E3/uL   Basophils Absolute 0.1 0 - 0 x10E3/uL   Immature Granulocytes 0 Not Estab. %   Immature Grans (Abs) 0.0 0.0 - 0.1 x10E3/uL  Comp Met (CMET)  Result Value Ref Range   Glucose 126 (H) 65 - 99 mg/dL   BUN 15 8 - 27 mg/dL   Creatinine, Ser 1.02 0.76 - 1.27 mg/dL   GFR calc non Af Amer 74 >59 mL/min/1.73   GFR calc Af Amer 85 >59 mL/min/1.73   BUN/Creatinine Ratio 15 10 - 24   Sodium 139 134 - 144 mmol/L   Potassium 5.2 3.5 - 5.2 mmol/L   Chloride 103 96 - 106 mmol/L   CO2 21 20 - 29 mmol/L   Calcium 9.4 8.6 - 10.2 mg/dL   Total Protein 6.9 6.0 - 8.5 g/dL   Albumin 4.5 3.7 - 4.7 g/dL   Globulin, Total 2.4 1.5 - 4.5 g/dL   Albumin/Globulin Ratio 1.9 1.2 - 2.2   Bilirubin Total 0.6 0.0 - 1.2 mg/dL   Alkaline Phosphatase 74 39 - 117 IU/L   AST 20 0 - 40 IU/L   ALT 13 0 - 44 IU/L  Lipid  Panel w/o Chol/HDL Ratio OUT  Result  Value Ref Range   Cholesterol, Total 164 100 - 199 mg/dL   Triglycerides 173 (H) 0 - 149 mg/dL   HDL 45 >39 mg/dL   VLDL Cholesterol Cal 30 5 - 40 mg/dL   LDL Chol Calc (NIH) 89 0 - 99 mg/dL  Microalbumin, Urine Waived  Result Value Ref Range   Microalb, Ur Waived 80 (H) 0 - 19 mg/L   Creatinine, Urine Waived 300 10 - 300 mg/dL   Microalb/Creat Ratio <30 <30 mg/g  PSA  Result Value Ref Range   Prostate Specific Ag, Serum 4.3 (H) 0.0 - 4.0 ng/mL  TSH  Result Value Ref Range   TSH 1.850 0.450 - 4.500 uIU/mL  UA/M w/rflx Culture, Routine   Specimen: Urine   URINE  Result Value Ref Range   Specific Gravity, UA 1.030 1.005 - 1.030   pH, UA 5.5 5.0 - 7.5   Color, UA Yellow Yellow   Appearance Ur Clear Clear   Leukocytes,UA Negative Negative   Protein,UA Negative Negative/Trace   Glucose, UA Negative Negative   Ketones, UA Negative Negative   RBC, UA Negative Negative   Bilirubin, UA Negative Negative   Urobilinogen, Ur 0.2 0.2 - 1.0 mg/dL   Nitrite, UA Negative Negative      Assessment & Plan:   Problem List Items Addressed This Visit      Cardiovascular and Mediastinum   Hypertension - Primary    Under good control on current regimen. Continue current regimen. Continue to monitor. Call with any concerns. Refills given. Labs drawn today.       Relevant Medications   simvastatin (ZOCOR) 20 MG tablet   benazepril (LOTENSIN) 40 MG tablet   Other Relevant Orders   CBC with Differential/Platelet   Microalbumin, Urine Waived     Endocrine   Diabetes mellitus without complication ([place])    Stable with A1c of 7.4- will really work on his diet and recheck in 3-6 months. Call with any concerns. Continue to monitor.       Relevant Medications   simvastatin (ZOCOR) 20 MG tablet   metFORMIN (GLUCOPHAGE-XR) 500 MG 24 hr tablet   benazepril (LOTENSIN) 40 MG tablet   Other Relevant Orders   Bayer DCA Hb A1c Waived   CBC with  Differential/Platelet   Microalbumin, Urine Waived     Genitourinary   Benign localized hyperplasia of prostate with urinary obstruction    Stable. Labs drawn today. Await results. Call with any concerns.       Relevant Orders   CBC with Differential/Platelet     Other   Hyperlipidemia    Under good control on current regimen. Continue current regimen. Continue to monitor. Call with any concerns. Refills given. Labs drawn today.      Relevant Medications   simvastatin (ZOCOR) 20 MG tablet   benazepril (LOTENSIN) 40 MG tablet   Other Relevant Orders   CBC with Differential/Platelet   Comprehensive metabolic panel   Lipid Panel w/o Chol/HDL Ratio   Elevated prostate specific antigen (PSA)    Rechecking labs today. Await results. Call with any concerns.       Relevant Orders   CBC with Differential/Platelet   PSA    Other Visit Diagnoses    Acute pain of right knee       Would like to hold on referral to ortho right now. Will call if he changes his mind.        Follow up plan: Return 3-6 months.

## 2020-03-24 NOTE — Assessment & Plan Note (Signed)
Rechecking labs today. Await results. Call with any concerns.  

## 2020-03-25 LAB — COMPREHENSIVE METABOLIC PANEL
ALT: 12 IU/L (ref 0–44)
AST: 15 IU/L (ref 0–40)
Albumin/Globulin Ratio: 1.9 (ref 1.2–2.2)
Albumin: 4.5 g/dL (ref 3.7–4.7)
Alkaline Phosphatase: 72 IU/L (ref 48–121)
BUN/Creatinine Ratio: 17 (ref 10–24)
BUN: 19 mg/dL (ref 8–27)
Bilirubin Total: 0.4 mg/dL (ref 0.0–1.2)
CO2: 21 mmol/L (ref 20–29)
Calcium: 9.9 mg/dL (ref 8.6–10.2)
Chloride: 105 mmol/L (ref 96–106)
Creatinine, Ser: 1.1 mg/dL (ref 0.76–1.27)
GFR calc Af Amer: 77 mL/min/{1.73_m2} (ref >59)
GFR calc non Af Amer: 67 mL/min/{1.73_m2} (ref >59)
Globulin, Total: 2.4 g/dL (ref 1.5–4.5)
Glucose: 147 mg/dL — ABNORMAL HIGH (ref 65–99)
Potassium: 5.6 mmol/L — ABNORMAL HIGH (ref 3.5–5.2)
Sodium: 139 mmol/L (ref 134–144)
Total Protein: 6.9 g/dL (ref 6.0–8.5)

## 2020-03-25 LAB — LIPID PANEL W/O CHOL/HDL RATIO
Cholesterol, Total: 169 mg/dL (ref 100–199)
HDL: 44 mg/dL (ref >39)
LDL Chol Calc (NIH): 90 mg/dL (ref 0–99)
Triglycerides: 208 mg/dL — ABNORMAL HIGH (ref 0–149)
VLDL Cholesterol Cal: 35 mg/dL (ref 5–40)

## 2020-03-25 LAB — PSA: Prostate Specific Ag, Serum: 4.9 ng/mL — ABNORMAL HIGH (ref 0.0–4.0)

## 2020-03-25 LAB — CBC WITH DIFFERENTIAL/PLATELET
Basophils Absolute: 0.1 10*3/uL (ref 0.0–0.2)
Basos: 3 %
EOS (ABSOLUTE): 0.4 10*3/uL (ref 0.0–0.4)
Eos: 8 %
Hematocrit: 40.5 % (ref 37.5–51.0)
Hemoglobin: 13.4 g/dL (ref 13.0–17.7)
Immature Grans (Abs): 0 10*3/uL (ref 0.0–0.1)
Immature Granulocytes: 1 %
Lymphocytes Absolute: 1.3 10*3/uL (ref 0.7–3.1)
Lymphs: 23 %
MCH: 32 pg (ref 26.6–33.0)
MCHC: 33.1 g/dL (ref 31.5–35.7)
MCV: 97 fL (ref 79–97)
Monocytes Absolute: 0.6 10*3/uL (ref 0.1–0.9)
Monocytes: 10 %
Neutrophils Absolute: 3.1 10*3/uL (ref 1.4–7.0)
Neutrophils: 55 %
Platelets: 283 10*3/uL (ref 150–450)
RBC: 4.19 x10E6/uL (ref 4.14–5.80)
RDW: 12.4 % (ref 11.6–15.4)
WBC: 5.6 10*3/uL (ref 3.4–10.8)

## 2020-06-17 ENCOUNTER — Encounter: Payer: Self-pay | Admitting: Family Medicine

## 2020-09-21 ENCOUNTER — Ambulatory Visit: Payer: Medicare PPO | Admitting: Family Medicine

## 2020-09-23 LAB — HM COLONOSCOPY

## 2020-09-28 ENCOUNTER — Encounter: Payer: Self-pay | Admitting: Family Medicine

## 2020-09-28 ENCOUNTER — Ambulatory Visit (INDEPENDENT_AMBULATORY_CARE_PROVIDER_SITE_OTHER): Payer: Medicare PPO

## 2020-09-28 ENCOUNTER — Other Ambulatory Visit: Payer: Self-pay

## 2020-09-28 ENCOUNTER — Ambulatory Visit (INDEPENDENT_AMBULATORY_CARE_PROVIDER_SITE_OTHER): Payer: Medicare PPO | Admitting: Family Medicine

## 2020-09-28 VITALS — BP 112/76 | HR 75 | Temp 98.0°F | Wt 198.0 lb

## 2020-09-28 VITALS — Ht 67.0 in | Wt 198.0 lb

## 2020-09-28 DIAGNOSIS — E7800 Pure hypercholesterolemia, unspecified: Secondary | ICD-10-CM

## 2020-09-28 DIAGNOSIS — E119 Type 2 diabetes mellitus without complications: Secondary | ICD-10-CM | POA: Diagnosis not present

## 2020-09-28 DIAGNOSIS — N138 Other obstructive and reflux uropathy: Secondary | ICD-10-CM

## 2020-09-28 DIAGNOSIS — Z23 Encounter for immunization: Secondary | ICD-10-CM | POA: Diagnosis not present

## 2020-09-28 DIAGNOSIS — I10 Essential (primary) hypertension: Secondary | ICD-10-CM

## 2020-09-28 DIAGNOSIS — N401 Enlarged prostate with lower urinary tract symptoms: Secondary | ICD-10-CM | POA: Diagnosis not present

## 2020-09-28 DIAGNOSIS — Z0000 Encounter for general adult medical examination without abnormal findings: Secondary | ICD-10-CM

## 2020-09-28 DIAGNOSIS — Z85038 Personal history of other malignant neoplasm of large intestine: Secondary | ICD-10-CM

## 2020-09-28 LAB — BAYER DCA HB A1C WAIVED: HB A1C (BAYER DCA - WAIVED): 5.7 % (ref <7.0)

## 2020-09-28 MED ORDER — BENAZEPRIL HCL 40 MG PO TABS: 40.0000 mg | ORAL_TABLET | Freq: Every day | ORAL | 1 refills | Status: DC

## 2020-09-28 MED ORDER — SIMVASTATIN 20 MG PO TABS: 20.0000 mg | ORAL_TABLET | Freq: Every day | ORAL | 1 refills | Status: DC

## 2020-09-28 MED ORDER — GLUCOSE BLOOD VI STRP: ORAL_STRIP | 12 refills | Status: DC

## 2020-09-28 MED ORDER — METFORMIN HCL ER 500 MG PO TB24: 1000.0000 mg | ORAL_TABLET | Freq: Two times a day (BID) | ORAL | 1 refills | Status: DC | PRN

## 2020-09-28 NOTE — Progress Notes (Signed)
BP 112/76   Pulse 75   Temp 98 F (36.7 C)   Wt 198 lb (89.8 kg)   SpO2 99%   BMI 31.01 kg/m    Subjective:    Patient ID: Ralph Chapman, male    DOB: 1946/03/22, 74 y.o.   MRN: [id number]  HPI: Ralph Chapman is a 74 y.o. male  Chief Complaint  Patient presents with  . Diabetes   DIABETES Hypoglycemic episodes:no Polydipsia/polyuria: no Visual disturbance: no Chest pain: no Paresthesias: no Glucose Monitoring: yes  Accucheck frequency: Daily  Fasting glucose: 98-100 Taking Insulin?: no Blood Pressure Monitoring: not checking Retinal Examination: Not up to Date Foot Exam: Up to Date Diabetic Education: Completed Pneumovax: Up to Date Influenza: Up to Date Aspirin: yes  HYPERTENSION / HYPERLIPIDEMIA Satisfied with current treatment? yes Duration of hypertension: chronic BP monitoring frequency: not checking BP medication side effects: no Past BP meds: benazepril Duration of hyperlipidemia: chronic Cholesterol medication side effects: no Cholesterol supplements: none Past cholesterol medications: simvastatin Medication compliance: excellent compliance Aspirin: yes Recent stressors: no Recurrent headaches: no Visual changes: no Palpitations: no Dyspnea: no Chest pain: no Lower extremity edema: no Dizzy/lightheaded: no  Relevant past medical, surgical, family and social history reviewed and updated as indicated. Interim medical history since our last visit reviewed. Allergies and medications reviewed and updated.  Review of Systems  Constitutional: Negative.   Respiratory: Negative.   Cardiovascular: Negative.   Gastrointestinal: Negative.   Musculoskeletal: Negative.   Neurological: Negative.   Psychiatric/Behavioral: Negative.     Per HPI unless specifically indicated above     Objective:    BP 112/76   Pulse 75   Temp 98 F (36.7 C)   Wt 198 lb (89.8 kg)   SpO2 99%   BMI 31.01 kg/m   Wt Readings from Last 3 Encounters:   09/28/20 198 lb (89.8 kg)  03/24/20 221 lb (100.2 kg)  11/06/19 210 lb (95.3 kg)    Physical Exam Vitals and nursing note reviewed.  Constitutional:      General: He is not in acute distress.    Appearance: Normal appearance. He is not ill-appearing, toxic-appearing or diaphoretic.  HENT:     Head: Normocephalic and atraumatic.     Right Ear: External ear normal.     Left Ear: External ear normal.     Nose: Nose normal.     Mouth/Throat:     Mouth: Mucous membranes are moist.     Pharynx: Oropharynx is clear.  Eyes:     General: No scleral icterus.       Right eye: No discharge.        Left eye: No discharge.     Extraocular Movements: Extraocular movements intact.     Conjunctiva/sclera: Conjunctivae normal.     Pupils: Pupils are equal, round, and reactive to light.  Cardiovascular:     Rate and Rhythm: Normal rate and regular rhythm.     Pulses: Normal pulses.     Heart sounds: Normal heart sounds. No murmur heard. No friction rub. No gallop.   Pulmonary:     Effort: Pulmonary effort is normal. No respiratory distress.     Breath sounds: Normal breath sounds. No stridor. No wheezing, rhonchi or rales.  Chest:     Chest wall: No tenderness.  Musculoskeletal:        General: Normal range of motion.     Cervical back: Normal range of motion and neck supple.  Skin:  General: Skin is warm and dry.     Capillary Refill: Capillary refill takes less than 2 seconds.     Coloration: Skin is not jaundiced or pale.     Findings: No bruising, erythema, lesion or rash.  Neurological:     General: No focal deficit present.     Mental Status: He is alert and oriented to person, place, and time. Mental status is at baseline.  Psychiatric:        Mood and Affect: Mood normal.        Behavior: Behavior normal.        Thought Content: Thought content normal.        Judgment: Judgment normal.     Results for orders placed or performed in visit on 09/24/20  HM COLONOSCOPY   Result Value Ref Range   HM Colonoscopy See Report (in chart) See Report (in chart), Patient Reported      Assessment & Plan:   Problem List Items Addressed This Visit      Cardiovascular and Mediastinum   Hypertension    Under good control on current regimen. Continue current regimen. Continue to monitor. Call with any concerns. Refills given. Labs drawn today.       Relevant Medications   simvastatin (ZOCOR) 20 MG tablet   benazepril (LOTENSIN) 40 MG tablet     Endocrine   Diabetes mellitus without complication ([facility])    Doing great with A1c of 5.7- will continue current regimen. OK to decrease metformin to 1500mg  daily. Call with any concerns.       Relevant Medications   simvastatin (ZOCOR) 20 MG tablet   metFORMIN (GLUCOPHAGE-XR) 500 MG 24 hr tablet   benazepril (LOTENSIN) 40 MG tablet   glucose blood (ONE TOUCH ULTRA TEST) test strip   Other Relevant Orders   CBC with Differential/Platelet   Comprehensive metabolic panel   Bayer DCA Hb A1c Waived     Genitourinary   Benign localized hyperplasia of prostate with urinary obstruction    Rechecking labs today. Await results.       Relevant Orders   PSA     Other   Hyperlipidemia    Under good control on current regimen. Continue current regimen. Continue to monitor. Call with any concerns. Refills given. Labs drawn today.       Relevant Medications   simvastatin (ZOCOR) 20 MG tablet   benazepril (LOTENSIN) 40 MG tablet   Other Relevant Orders   CBC with Differential/Platelet   Comprehensive metabolic panel   Lipid Panel w/o Chol/HDL Ratio   History of colon cancer    Following with GI with annual colonoscopies       Other Visit Diagnoses    Need for influenza vaccination    -  Primary   Relevant Orders   Flu Vaccine QUAD High Dose(Fluad) (Completed)   Essential hypertension       Relevant Medications   simvastatin (ZOCOR) 20 MG tablet   benazepril (LOTENSIN) 40 MG tablet   Other Relevant Orders    CBC with Differential/Platelet   Comprehensive metabolic panel       Follow up plan: Return in about 6 months (around 03/29/2021) for physical.

## 2020-09-28 NOTE — Progress Notes (Signed)
I connected with Ralph Chapman today by telephone and verified that I am speaking with the correct person using two identifiers. Location patient: home Location provider: work Persons participating in the virtual visit: Ralph Chapman, Glenna Durand LPN.   I discussed the limitations, risks, security and privacy concerns of performing an evaluation and management service by telephone and the availability of in person appointments. I also discussed with the patient that there may be a patient responsible charge related to this service. The patient expressed understanding and verbally consented to this telephonic visit.    Interactive audio and video telecommunications were attempted between this provider and patient, however failed, due to patient having technical difficulties OR patient did not have access to video capability.  We continued and completed visit with audio only.     Vital signs may be patient reported or missing.  Subjective:   Ralph Chapman is a 74 y.o. male who presents for Medicare Annual/Subsequent preventive examination.  Review of Systems     Cardiac Risk Factors include: advanced age (>76men, >18 women);diabetes mellitus;male gender;obesity (BMI >30kg/m2)     Objective:    Today's Vitals   09/28/20 1426  Weight: 198 lb (89.8 kg)  Height: 5\' 7"  (1.702 m)   Body mass index is 31.01 kg/m.  Advanced Directives 09/28/2020 09/25/2019 09/13/2018 09/01/2017 05/23/2017 09/05/2016  Does Patient Have a Medical Advance Directive? No No No No Yes;No No  Would patient like information on creating a medical advance directive? - - Yes (MAU/Ambulatory/Procedural Areas - Information given) Yes (MAU/Ambulatory/Procedural Areas - Information given) - No - Patient declined    Current Medications (verified) Outpatient Encounter Medications as of 09/28/2020  Medication Sig  . benazepril (LOTENSIN) 40 MG tablet Take 1 tablet (40 mg total) by mouth daily.  . Cholecalciferol  125 MCG (5000 UT) TABS Take by mouth.  Marland Kitchen glucosamine-chondroitin 500-400 MG tablet Take by mouth.  . metFORMIN (GLUCOPHAGE-XR) 500 MG 24 hr tablet Take 2 tablets (1,000 mg total) by mouth 2 (two) times daily as needed.  . simvastatin (ZOCOR) 20 MG tablet Take 1 tablet (20 mg total) by mouth at bedtime.   No facility-administered encounter medications on file as of 09/28/2020.    Allergies (verified) Aspirin, Niacin and related, and Tramadol   History: Past Medical History:  Diagnosis Date  . Diabetes mellitus without complication ([place])   . Hyperlipidemia   . Hypertension    Past Surgical History:  Procedure Laterality Date  . APPENDECTOMY    . COLONOSCOPY WITH PROPOFOL N/A 09/05/2016   Procedure: COLONOSCOPY WITH PROPOFOL;  Surgeon: Lucilla Lame, MD;  Location: [facility];  Service: Endoscopy;  Laterality: N/A;  Diabetic - oral meds  . POLYPECTOMY  09/05/2016   Procedure: POLYPECTOMY;  Surgeon: Lucilla Lame, MD;  Location: [facility];  Service: Endoscopy;;  . TONSILLECTOMY     Family History  Problem Relation Age of Onset  . Cancer Mother   . Heart attack Father 57  . Heart disease Brother    Social History   Socioeconomic History  . Marital status: Married    Spouse name: Not on file  . Number of children: Not on file  . Years of education: Not on file  . Highest education level: Not on file  Occupational History  . Not on file  Tobacco Use  . Smoking status: Never Smoker  . Smokeless tobacco: Never Used  Vaping Use  . Vaping Use: Never used  Substance and Sexual Activity  . Alcohol  use: No  . Drug use: No  . Sexual activity: Not on file  Other Topics Concern  . Not on file  Social History Narrative  . Not on file   Social Determinants of Health   Financial Resource Strain: Low Risk   . Difficulty of Paying Living Expenses: Not hard at all  Food Insecurity: No Food Insecurity  . Worried About Charity fundraiser in the Last Year: Never  true  . Ran Out of Food in the Last Year: Never true  Transportation Needs: No Transportation Needs  . Lack of Transportation (Medical): No  . Lack of Transportation (Non-Medical): No  Physical Activity: Sufficiently Active  . Days of Exercise per Week: 7 days  . Minutes of Exercise per Session: 30 min  Stress: No Stress Concern Present  . Feeling of Stress : Not at all  Social Connections: Not on file    Tobacco Counseling Counseling given: Not Answered   Clinical Intake:  Pre-visit preparation completed: Yes  Pain : No/denies pain     Nutritional Status: BMI > 30  Obese Nutritional Risks: None Diabetes: Yes  How often do you need to have someone help you when you read instructions, pamphlets, or other written materials from your doctor or pharmacy?: 1 - Never What is the last grade level you completed in school?: technical college  Diabetic? Yes Nutrition Risk Assessment:  Has the patient had any N/V/D within the last 2 months?  No  Does the patient have any non-healing wounds?  No  Has the patient had any unintentional weight loss or weight gain?  No   Diabetes:  Is the patient diabetic?  Yes  If diabetic, was a CBG obtained today?  No  Did the patient bring in their glucometer from home?  No  How often do you monitor your CBG's? Twice .   Financial Strains and Diabetes Management:  Are you having any financial strains with the device, your supplies or your medication? No .  Does the patient want to be seen by Chronic Care Management for management of their diabetes?  No  Would the patient like to be referred to a Nutritionist or for Diabetic Management?  No   Diabetic Exams:  Diabetic Eye Exam: Overdue for diabetic eye exam. Pt has been advised about the importance in completing this exam. Patient advised to call and schedule an eye exam. Diabetic Foot Exam: Completed today   Interpreter Needed?: No  Information entered by :: NAllen LPN   Activities  of Daily Living In your present state of health, do you have any difficulty performing the following activities: 09/28/2020 09/28/2020  Hearing? N N  Vision? N N  Difficulty concentrating or making decisions? N N  Walking or climbing stairs? N N  Dressing or bathing? N N  Doing errands, shopping? N N  Preparing Food and eating ? N -  Using the Toilet? N -  In the past six months, have you accidently leaked urine? N -  Do you have problems with loss of bowel control? N -  Managing your Medications? N -  Managing your Finances? N -  Housekeeping or managing your Housekeeping? N -  Some recent data might be hidden    Patient Care Team: Valerie Roys, DO as PCP - General (Family Medicine) Lucilla Lame, MD as Consulting Physician (Gastroenterology)  Indicate any recent Medical Services you may have received from other than [facility] providers in the past year (date may be approximate).  Assessment:   This is a routine wellness examination for [place].  Hearing/Vision screen  Hearing Screening   125Hz  250Hz  500Hz  1000Hz  2000Hz  3000Hz  4000Hz  6000Hz  8000Hz   Right ear:           Left ear:           Vision Screening Comments: Regular eye exams, Dr. Wyatt Portela  Dietary issues and exercise activities discussed: Current Exercise Habits: Home exercise routine, Type of exercise: walking, Time (Minutes): 30, Frequency (Times/Week): 7, Weekly Exercise (Minutes/Week): 210  Goals    . DIET - INCREASE WATER INTAKE     Recommend drinking at least 5-6 glasses of water a day     . Patient Stated     09/28/2020, wants to lose 20 pounds      Depression Screen PHQ 2/9 Scores 09/28/2020 09/28/2020 09/25/2019 09/13/2018 04/11/2018 09/05/2017 09/01/2017  PHQ - 2 Score 0 0 0 0 0 0 0  PHQ- 9 Score - - - - - - 0    Fall Risk Fall Risk  09/28/2020 09/28/2020 09/25/2019 09/13/2018 04/11/2018  Falls in the past year? 0 0 0 0 No  Number falls in past yr: - 0 0 0 -  Injury with Fall? - 0 0 0 -  Risk for  fall due to : Medication side effect History of fall(s) - - -  Follow up Falls evaluation completed;Education provided;Falls prevention discussed Falls evaluation completed - - -    FALL RISK PREVENTION PERTAINING TO THE HOME:  Any stairs in or around the home? No  If so, are there any without handrails? No  Home free of loose throw rugs in walkways, pet beds, electrical cords, etc? Yes  Adequate lighting in your home to reduce risk of falls? Yes   ASSISTIVE DEVICES UTILIZED TO PREVENT FALLS:  Life alert? No  Use of a cane, walker or w/c? No  Grab bars in the bathroom? No  Shower chair or bench in shower? Yes  Elevated toilet seat or a handicapped toilet? Yes   TIMED UP AND GO:  Was the test performed? No .   Cognitive Function:     6CIT Screen 09/28/2020 09/13/2018 09/01/2017  What Year? 0 points 0 points 0 points  What month? 0 points 0 points 0 points  What time? 0 points 0 points 0 points  Count back from 20 0 points 0 points 0 points  Months in reverse 0 points 0 points 0 points  Repeat phrase 0 points 2 points 0 points  Total Score 0 2 0    Immunizations Immunization History  Administered Date(s) Administered  . Fluad Quad(high Dose 65+) 09/28/2020  . Influenza, High Dose Seasonal PF 08/01/2016, 09/01/2017, 09/13/2018  . Influenza,inj,quad, With Preservative 06/26/2019  . Influenza-Unspecified 08/17/2015  . Moderna Sars-Covid-2 Vaccination 11/15/2019, 12/17/2019  . Pneumococcal Conjugate-13 05/15/2014  . Pneumococcal Polysaccharide-23 04/13/2013  . Pneumococcal-Unspecified 03/21/2007, 04/10/2013  . Tdap 05/15/2014  . Zoster 04/15/2013  . Zoster Recombinat (Shingrix) 11/23/2018, 12/25/2018, 04/01/2019    TDAP status: Up to date  Flu Vaccine status: Up to date  Pneumococcal vaccine status: Up to date  Covid-19 vaccine status: Completed vaccines  Qualifies for Shingles Vaccine? Yes   Zostavax completed Yes   Shingrix Completed?: Yes  Screening  Tests Health Maintenance  Topic Date Due  . HEMOGLOBIN A1C  09/23/2020  . OPHTHALMOLOGY EXAM  09/28/2020 (Originally 08/27/2020)  . COVID-19 Vaccine (3 - Moderna risk 4-dose series) 10/14/2020 (Originally 01/14/2020)  . COLONOSCOPY  09/23/2021  . FOOT EXAM  09/28/2021  . TETANUS/TDAP  05/15/2024  . INFLUENZA VACCINE  Completed  . Hepatitis C Screening  Completed  . PNA vac Low Risk Adult  Completed    Health Maintenance  Health Maintenance Due  Topic Date Due  . HEMOGLOBIN A1C  09/23/2020    Colorectal cancer screening: Type of screening: Colonoscopy. Completed 09/23/2020. Repeat every 1 years  Lung Cancer Screening: (Low Dose CT Chest recommended if Age 59-80 years, 30 pack-year currently smoking OR have quit w/in 15years.) does not qualify.   Lung Cancer Screening Referral: no  Additional Screening:  Hepatitis C Screening: does qualify; Completed 12/15/2015  Vision Screening: Recommended annual ophthalmology exams for early detection of glaucoma and other disorders of the eye. Is the patient up to date with their annual eye exam?  Yes  Who is the provider or what is the name of the office in which the patient attends annual eye exams? Dr. Wyatt Portela If pt is not established with a provider, would they like to be referred to a provider to establish care? No .   Dental Screening: Recommended annual dental exams for proper oral hygiene  Community Resource Referral / Chronic Care Management: CRR required this visit?  No   CCM required this visit?  No      Plan:     I have personally reviewed and noted the following in the patient's chart:   . Medical and social history . Use of alcohol, tobacco or illicit drugs  . Current medications and supplements . Functional ability and status . Nutritional status . Physical activity . Advanced directives . List of other physicians . Hospitalizations, surgeries, and ER visits in previous 12 months . Vitals . Screenings to include  cognitive, depression, and falls . Referrals and appointments  In addition, I have reviewed and discussed with patient certain preventive protocols, quality metrics, and best practice recommendations. A written personalized care plan for preventive services as well as general preventive health recommendations were provided to patient.     Kellie Simmering, LPN   [id number]   Nurse Notes:

## 2020-09-28 NOTE — Assessment & Plan Note (Signed)
Under good control on current regimen. Continue current regimen. Continue to monitor. Call with any concerns. Refills given. Labs drawn today.   

## 2020-09-28 NOTE — Assessment & Plan Note (Signed)
Following with GI with annual colonoscopies

## 2020-09-28 NOTE — Assessment & Plan Note (Signed)
Rechecking labs today. Await results.  

## 2020-09-28 NOTE — Patient Instructions (Signed)
Ralph Chapman , Thank you for taking time to come for your Medicare Wellness Visit. I appreciate your ongoing commitment to your health goals. Please review the following plan we discussed and let me know if I can assist you in the future.   Screening recommendations/referrals: Colonoscopy: completed 09/23/2020 Recommended yearly ophthalmology/optometry visit for glaucoma screening and checkup Recommended yearly dental visit for hygiene and checkup  Vaccinations: Influenza vaccine: today Pneumococcal vaccine: completed 05/15/2014 Tdap vaccine: completed 05/15/2014 Shingles vaccine: completed   Covid-19:  11/15/2019, 12/17/2019, 08/06/2020  Advanced directives: Advance directive discussed with you today.    Conditions/risks identified: none  Next appointment: Follow up in one year for your annual wellness visit.   Preventive Care 72 Years and Older, Male Preventive care refers to lifestyle choices and visits with your health care provider that can promote health and wellness. What does preventive care include?  A yearly physical exam. This is also called an annual well check.  Dental exams once or twice a year.  Routine eye exams. Ask your health care provider how often you should have your eyes checked.  Personal lifestyle choices, including:  Daily care of your teeth and gums.  Regular physical activity.  Eating a healthy diet.  Avoiding tobacco and drug use.  Limiting alcohol use.  Practicing safe sex.  Taking low doses of aspirin every day.  Taking vitamin and mineral supplements as recommended by your health care provider. What happens during an annual well check? The services and screenings done by your health care provider during your annual well check will depend on your age, overall health, lifestyle risk factors, and family history of disease. Counseling  Your health care provider may ask you questions about your:  Alcohol use.  Tobacco use.  Drug  use.  Emotional well-being.  Home and relationship well-being.  Sexual activity.  Eating habits.  History of falls.  Memory and ability to understand (cognition).  Work and work Statistician. Screening  You may have the following tests or measurements:  Height, weight, and BMI.  Blood pressure.  Lipid and cholesterol levels. These may be checked every 5 years, or more frequently if you are over 83 years old.  Skin check.  Lung cancer screening. You may have this screening every year starting at age 59 if you have a 30-pack-year history of smoking and currently smoke or have quit within the past 15 years.  Fecal occult blood test (FOBT) of the stool. You may have this test every year starting at age 59.  Flexible sigmoidoscopy or colonoscopy. You may have a sigmoidoscopy every 5 years or a colonoscopy every 10 years starting at age 61.  Prostate cancer screening. Recommendations will vary depending on your family history and other risks.  Hepatitis C blood test.  Hepatitis B blood test.  Sexually transmitted disease (STD) testing.  Diabetes screening. This is done by checking your blood sugar (glucose) after you have not eaten for a while (fasting). You may have this done every 1-3 years.  Abdominal aortic aneurysm (AAA) screening. You may need this if you are a current or former smoker.  Osteoporosis. You may be screened starting at age 20 if you are at high risk. Talk with your health care provider about your test results, treatment options, and if necessary, the need for more tests. Vaccines  Your health care provider may recommend certain vaccines, such as:  Influenza vaccine. This is recommended every year.  Tetanus, diphtheria, and acellular pertussis (Tdap, Td) vaccine. You may  need a Td booster every 10 years.  Zoster vaccine. You may need this after age 58.  Pneumococcal 13-valent conjugate (PCV13) vaccine. One dose is recommended after age  45.  Pneumococcal polysaccharide (PPSV23) vaccine. One dose is recommended after age 14. Talk to your health care provider about which screenings and vaccines you need and how often you need them. This information is not intended to replace advice given to you by your health care provider. Make sure you discuss any questions you have with your health care provider. Document Released: 10/23/2015 Document Revised: 06/15/2016 Document Reviewed: 07/28/2015 Elsevier Interactive Patient Education  2017 [place] Prevention in the Home Falls can cause injuries. They can happen to people of all ages. There are many things you can do to make your home safe and to help prevent falls. What can I do on the outside of my home?  Regularly fix the edges of walkways and driveways and fix any cracks.  Remove anything that might make you trip as you walk through a door, such as a raised step or threshold.  Trim any bushes or trees on the path to your home.  Use bright outdoor lighting.  Clear any walking paths of anything that might make someone trip, such as rocks or tools.  Regularly check to see if handrails are loose or broken. Make sure that both sides of any steps have handrails.  Any raised decks and porches should have guardrails on the edges.  Have any leaves, snow, or ice cleared regularly.  Use sand or salt on walking paths during winter.  Clean up any spills in your garage right away. This includes oil or grease spills. What can I do in the bathroom?  Use night lights.  Install grab bars by the toilet and in the tub and shower. Do not use towel bars as grab bars.  Use non-skid mats or decals in the tub or shower.  If you need to sit down in the shower, use a plastic, non-slip stool.  Keep the floor dry. Clean up any water that spills on the floor as soon as it happens.  Remove soap buildup in the tub or shower regularly.  Attach bath mats securely with double-sided  non-slip rug tape.  Do not have throw rugs and other things on the floor that can make you trip. What can I do in the bedroom?  Use night lights.  Make sure that you have a light by your bed that is easy to reach.  Do not use any sheets or blankets that are too big for your bed. They should not hang down onto the floor.  Have a firm chair that has side arms. You can use this for support while you get dressed.  Do not have throw rugs and other things on the floor that can make you trip. What can I do in the kitchen?  Clean up any spills right away.  Avoid walking on wet floors.  Keep items that you use a lot in easy-to-reach places.  If you need to reach something above you, use a strong step stool that has a grab bar.  Keep electrical cords out of the way.  Do not use floor polish or wax that makes floors slippery. If you must use wax, use non-skid floor wax.  Do not have throw rugs and other things on the floor that can make you trip. What can I do with my stairs?  Do not leave any items on  the stairs.  Make sure that there are handrails on both sides of the stairs and use them. Fix handrails that are broken or loose. Make sure that handrails are as long as the stairways.  Check any carpeting to make sure that it is firmly attached to the stairs. Fix any carpet that is loose or worn.  Avoid having throw rugs at the top or bottom of the stairs. If you do have throw rugs, attach them to the floor with carpet tape.  Make sure that you have a light switch at the top of the stairs and the bottom of the stairs. If you do not have them, ask someone to add them for you. What else can I do to help prevent falls?  Wear shoes that:  Do not have high heels.  Have rubber bottoms.  Are comfortable and fit you well.  Are closed at the toe. Do not wear sandals.  If you use a stepladder:  Make sure that it is fully opened. Do not climb a closed stepladder.  Make sure that both  sides of the stepladder are locked into place.  Ask someone to hold it for you, if possible.  Clearly mark and make sure that you can see:  Any grab bars or handrails.  First and last steps.  Where the edge of each step is.  Use tools that help you move around (mobility aids) if they are needed. These include:  Canes.  Walkers.  Scooters.  Crutches.  Turn on the lights when you go into a dark area. Replace any light bulbs as soon as they burn out.  Set up your furniture so you have a clear path. Avoid moving your furniture around.  If any of your floors are uneven, fix them.  If there are any pets around you, be aware of where they are.  Review your medicines with your doctor. Some medicines can make you feel dizzy. This can increase your chance of falling. Ask your doctor what other things that you can do to help prevent falls. This information is not intended to replace advice given to you by your health care provider. Make sure you discuss any questions you have with your health care provider. Document Released: 07/23/2009 Document Revised: 03/03/2016 Document Reviewed: 10/31/2014 Elsevier Interactive Patient Education  2017 [employer].

## 2020-09-28 NOTE — Assessment & Plan Note (Signed)
Doing great with A1c of 5.7- will continue current regimen. OK to decrease metformin to 1500mg  daily. Call with any concerns.

## 2020-09-29 LAB — CBC WITH DIFFERENTIAL/PLATELET
Basophils Absolute: 0.1 10*3/uL (ref 0.0–0.2)
Basos: 3 %
EOS (ABSOLUTE): 0.4 10*3/uL (ref 0.0–0.4)
Eos: 7 %
Hematocrit: 42.6 % (ref 37.5–51.0)
Hemoglobin: 14 g/dL (ref 13.0–17.7)
Immature Grans (Abs): 0 10*3/uL (ref 0.0–0.1)
Immature Granulocytes: 0 %
Lymphocytes Absolute: 1.3 10*3/uL (ref 0.7–3.1)
Lymphs: 23 %
MCH: 31.3 pg (ref 26.6–33.0)
MCHC: 32.9 g/dL (ref 31.5–35.7)
MCV: 95 fL (ref 79–97)
Monocytes Absolute: 0.6 10*3/uL (ref 0.1–0.9)
Monocytes: 10 %
Neutrophils Absolute: 3.2 10*3/uL (ref 1.4–7.0)
Neutrophils: 57 %
Platelets: 307 10*3/uL (ref 150–450)
RBC: 4.48 x10E6/uL (ref 4.14–5.80)
RDW: 12.5 % (ref 11.6–15.4)
WBC: 5.6 10*3/uL (ref 3.4–10.8)

## 2020-09-29 LAB — COMPREHENSIVE METABOLIC PANEL
ALT: 14 IU/L (ref 0–44)
AST: 19 IU/L (ref 0–40)
Albumin/Globulin Ratio: 2 (ref 1.2–2.2)
Albumin: 4.6 g/dL (ref 3.7–4.7)
Alkaline Phosphatase: 70 IU/L (ref 44–121)
BUN/Creatinine Ratio: 22 (ref 10–24)
BUN: 25 mg/dL (ref 8–27)
Bilirubin Total: 0.5 mg/dL (ref 0.0–1.2)
CO2: 19 mmol/L — ABNORMAL LOW (ref 20–29)
Calcium: 9.2 mg/dL (ref 8.6–10.2)
Chloride: 107 mmol/L — ABNORMAL HIGH (ref 96–106)
Creatinine, Ser: 1.16 mg/dL (ref 0.76–1.27)
GFR calc Af Amer: 72 mL/min/{1.73_m2} (ref >59)
GFR calc non Af Amer: 63 mL/min/{1.73_m2} (ref >59)
Globulin, Total: 2.3 g/dL (ref 1.5–4.5)
Glucose: 119 mg/dL — ABNORMAL HIGH (ref 65–99)
Potassium: 5.1 mmol/L (ref 3.5–5.2)
Sodium: 141 mmol/L (ref 134–144)
Total Protein: 6.9 g/dL (ref 6.0–8.5)

## 2020-09-29 LAB — LIPID PANEL W/O CHOL/HDL RATIO
Cholesterol, Total: 144 mg/dL (ref 100–199)
HDL: 44 mg/dL (ref >39)
LDL Chol Calc (NIH): 79 mg/dL (ref 0–99)
Triglycerides: 118 mg/dL (ref 0–149)
VLDL Cholesterol Cal: 21 mg/dL (ref 5–40)

## 2020-09-29 LAB — PSA: Prostate Specific Ag, Serum: 5.2 ng/mL — ABNORMAL HIGH (ref 0.0–4.0)

## 2020-09-29 LAB — HM DIABETES EYE EXAM

## 2021-03-23 ENCOUNTER — Other Ambulatory Visit: Payer: Self-pay | Admitting: Family Medicine

## 2021-03-23 DIAGNOSIS — E7800 Pure hypercholesterolemia, unspecified: Secondary | ICD-10-CM

## 2021-03-23 DIAGNOSIS — I10 Essential (primary) hypertension: Secondary | ICD-10-CM

## 2021-04-01 ENCOUNTER — Ambulatory Visit (INDEPENDENT_AMBULATORY_CARE_PROVIDER_SITE_OTHER): Payer: Medicare PPO | Admitting: Family Medicine

## 2021-04-01 ENCOUNTER — Encounter: Payer: Self-pay | Admitting: Family Medicine

## 2021-04-01 ENCOUNTER — Other Ambulatory Visit: Payer: Self-pay

## 2021-04-01 VITALS — BP 111/71 | HR 69 | Temp 98.1°F | Ht 65.0 in | Wt 203.8 lb

## 2021-04-01 DIAGNOSIS — N138 Other obstructive and reflux uropathy: Secondary | ICD-10-CM

## 2021-04-01 DIAGNOSIS — E7800 Pure hypercholesterolemia, unspecified: Secondary | ICD-10-CM | POA: Diagnosis not present

## 2021-04-01 DIAGNOSIS — Z0000 Encounter for general adult medical examination without abnormal findings: Secondary | ICD-10-CM

## 2021-04-01 DIAGNOSIS — E119 Type 2 diabetes mellitus without complications: Secondary | ICD-10-CM | POA: Diagnosis not present

## 2021-04-01 DIAGNOSIS — N401 Enlarged prostate with lower urinary tract symptoms: Secondary | ICD-10-CM

## 2021-04-01 DIAGNOSIS — I10 Essential (primary) hypertension: Secondary | ICD-10-CM

## 2021-04-01 LAB — URINALYSIS, ROUTINE W REFLEX MICROSCOPIC
Bilirubin, UA: NEGATIVE
Glucose, UA: NEGATIVE
Ketones, UA: NEGATIVE
Nitrite, UA: NEGATIVE
Protein,UA: NEGATIVE
RBC, UA: NEGATIVE
Specific Gravity, UA: 1.025 (ref 1.005–1.030)
Urobilinogen, Ur: 0.2 mg/dL (ref 0.2–1.0)
pH, UA: 5 (ref 5.0–7.5)

## 2021-04-01 LAB — MICROSCOPIC EXAMINATION
Bacteria, UA: NONE SEEN
Epithelial Cells (non renal): NONE SEEN /hpf (ref 0–10)
RBC, Urine: NONE SEEN /hpf (ref 0–2)

## 2021-04-01 LAB — MICROALBUMIN, URINE WAIVED
Creatinine, Urine Waived: 300 mg/dL (ref 10–300)
Microalb, Ur Waived: 30 mg/L — ABNORMAL HIGH (ref 0–19)
Microalb/Creat Ratio: <30 mg/g (ref <30)

## 2021-04-01 LAB — BAYER DCA HB A1C WAIVED: HB A1C (BAYER DCA - WAIVED): 6.4 % (ref <7.0)

## 2021-04-01 MED ORDER — BENAZEPRIL HCL 40 MG PO TABS: 40.0000 mg | ORAL_TABLET | Freq: Every day | ORAL | 1 refills | Status: DC

## 2021-04-01 MED ORDER — SIMVASTATIN 20 MG PO TABS: 20.0000 mg | ORAL_TABLET | Freq: Every day | ORAL | 1 refills | Status: DC

## 2021-04-01 MED ORDER — METFORMIN HCL ER 500 MG PO TB24: 1000.0000 mg | ORAL_TABLET | Freq: Two times a day (BID) | ORAL | 1 refills | Status: DC | PRN

## 2021-04-01 NOTE — Assessment & Plan Note (Signed)
Doing well with A1c of 6.4- continue current regimen. Continue to monitor. Call with any concerns.

## 2021-04-01 NOTE — Assessment & Plan Note (Signed)
Under good control on current regimen. Continue current regimen. Continue to monitor. Call with any concerns. Refills given. Labs drawn today.   

## 2021-04-01 NOTE — Progress Notes (Signed)
BP 111/71   Pulse 69   Temp 98.1 F (36.7 C)   Ht 5\' 5"  (1.651 m)   Wt 203 lb 12.8 oz (92.4 kg)   SpO2 96%   BMI 33.91 kg/m    Subjective:    Patient ID: Ralph Chapman, male    DOB: 11/11/1945, 75 y.o.   MRN: [id number]  HPI: Ralph Chapman is a 75 y.o. male presenting on 04/01/2021 for comprehensive medical examination. Current medical complaints include:  HYPERTENSION / HYPERLIPIDEMIA Satisfied with current treatment? no Duration of hypertension: chronic BP monitoring frequency: not checking BP medication side effects: no Past BP meds: benazepril Duration of hyperlipidemia: chronic Cholesterol medication side effects: no Cholesterol supplements: none Past cholesterol medications: simvastatin Medication compliance: excellent compliance Aspirin: no Recent stressors: no Recurrent headaches: no Visual changes: no Palpitations: no Dyspnea: no Chest pain: no Lower extremity edema: no Dizzy/lightheaded: no  DIABETES Hypoglycemic episodes:no Polydipsia/polyuria: no Visual disturbance: no Chest pain: no Paresthesias: no Glucose Monitoring: no  Accucheck frequency: Not Checking Taking Insulin?: no Blood Pressure Monitoring: not checking Retinal Examination: Up to Date Foot Exam: Up to Date Diabetic Education: Completed Pneumovax: Up to Date Influenza: Up to Date Aspirin: no   He currently lives with: wife Interim Problems from his last visit: no  Depression Screen done today and results listed below:  Depression screen [facility] 2/9 04/01/2021 09/28/2020 09/28/2020 09/25/2019 09/13/2018  Decreased Interest 0 0 0 0 0  Down, Depressed, Hopeless 0 0 0 0 0  PHQ - 2 Score 0 0 0 0 0  Altered sleeping - - - - -  Tired, decreased energy - - - - -  Change in appetite - - - - -  Feeling bad or failure about yourself  - - - - -  Trouble concentrating - - - - -  Moving slowly or fidgety/restless - - - - -  Suicidal thoughts - - - - -  PHQ-9 Score - - - - -  Difficult  doing work/chores - - - - -     Past Medical History:  Past Medical History:  Diagnosis Date   Diabetes mellitus without complication ([place])    Hyperlipidemia    Hypertension     Surgical History:  Past Surgical History:  Procedure Laterality Date   APPENDECTOMY     COLONOSCOPY WITH PROPOFOL N/A 09/05/2016   Procedure: COLONOSCOPY WITH PROPOFOL;  Surgeon: Lucilla Lame, MD;  Location: [place];  Service: Endoscopy;  Laterality: N/A;  Diabetic - oral meds   POLYPECTOMY  09/05/2016   Procedure: POLYPECTOMY;  Surgeon: Lucilla Lame, MD;  Location: [facility];  Service: Endoscopy;;   TONSILLECTOMY      Medications:  Current Outpatient Medications on File Prior to Visit  Medication Sig   Cholecalciferol 125 MCG (5000 UT) TABS Take by mouth.   glucosamine-chondroitin 500-400 MG tablet Take by mouth.   glucose blood (ONE TOUCH ULTRA TEST) test strip USE TO CHECK BLOOD GLUCOSE TWICE DAILY   No current facility-administered medications on file prior to visit.    Allergies:  Allergies  Allergen Reactions   Aspirin Hives, Shortness Of Breath and Swelling    Lips swell   Niacin And Related Swelling   Tramadol Nausea And Vomiting    Social History:  Social History   Socioeconomic History   Marital status: Married    Spouse name: Not on file   Number of children: Not on file   Years of education: Not on file  Highest education level: Not on file  Occupational History   Not on file  Tobacco Use   Smoking status: Never   Smokeless tobacco: Never  Vaping Use   Vaping Use: Never used  Substance and Sexual Activity   Alcohol use: No   Drug use: No   Sexual activity: Not Currently  Other Topics Concern   Not on file  Social History Narrative   Not on file   Social Determinants of Health   Financial Resource Strain: Low Risk    Difficulty of Paying Living Expenses: Not hard at all  Food Insecurity: No Food Insecurity   Worried About Sales executive in the Last Year: Never true   [place] in the Last Year: Never true  Transportation Needs: No Transportation Needs   Lack of Transportation (Medical): No   Lack of Transportation (Non-Medical): No  Physical Activity: Sufficiently Active   Days of Exercise per Week: 7 days   Minutes of Exercise per Session: 30 min  Stress: No Stress Concern Present   Feeling of Stress : Not at all  Social Connections: Not on file  Intimate Partner Violence: Not on file   Social History   Tobacco Use  Smoking Status Never  Smokeless Tobacco Never   Social History   Substance and Sexual Activity  Alcohol Use No    Family History:  Family History  Problem Relation Age of Onset   Cancer Mother    Heart attack Father 50   Heart disease Brother     Past medical history, surgical history, medications, allergies, family history and social history reviewed with patient today and changes made to appropriate areas of the chart.   Review of Systems  Constitutional: Negative.   HENT: Negative.    Eyes: Negative.   Respiratory: Negative.    Cardiovascular: Negative.   Gastrointestinal: Negative.   Genitourinary: Negative.   Musculoskeletal: Negative.   Skin: Negative.   Neurological: Negative.   Endo/Heme/Allergies: Negative.   Psychiatric/Behavioral: Negative.    All other ROS negative except what is listed above and in the HPI.      Objective:    BP 111/71   Pulse 69   Temp 98.1 F (36.7 C)   Ht 5\' 5"  (1.651 m)   Wt 203 lb 12.8 oz (92.4 kg)   SpO2 96%   BMI 33.91 kg/m   Wt Readings from Last 3 Encounters:  04/01/21 203 lb 12.8 oz (92.4 kg)  09/28/20 198 lb (89.8 kg)  09/28/20 198 lb (89.8 kg)    Physical Exam Vitals and nursing note reviewed.  Constitutional:      General: He is not in acute distress.    Appearance: Normal appearance. He is normal weight. He is not ill-appearing, toxic-appearing or diaphoretic.  HENT:     Head: Normocephalic and atraumatic.      Right Ear: Tympanic membrane, ear canal and external ear normal. There is no impacted cerumen.     Left Ear: Tympanic membrane, ear canal and external ear normal. There is no impacted cerumen.     Nose: Nose normal. No congestion or rhinorrhea.     Mouth/Throat:     Mouth: Mucous membranes are moist.     Pharynx: Oropharynx is clear. No oropharyngeal exudate or posterior oropharyngeal erythema.  Eyes:     General: No scleral icterus.       Right eye: No discharge.        Left eye: No discharge.  Extraocular Movements: Extraocular movements intact.     Conjunctiva/sclera: Conjunctivae normal.     Pupils: Pupils are equal, round, and reactive to light.  Neck:     Vascular: No carotid bruit.  Cardiovascular:     Rate and Rhythm: Normal rate and regular rhythm.     Pulses: Normal pulses.     Heart sounds: No murmur heard.   No friction rub. No gallop.  Pulmonary:     Effort: Pulmonary effort is normal. No respiratory distress.     Breath sounds: Normal breath sounds. No stridor. No wheezing, rhonchi or rales.  Chest:     Chest wall: No tenderness.  Abdominal:     General: Abdomen is flat. Bowel sounds are normal. There is no distension.     Palpations: Abdomen is soft. There is no mass.     Tenderness: no abdominal tenderness There is no right CVA tenderness, left CVA tenderness, guarding or rebound.     Hernia: No hernia is present.  Genitourinary:    Comments: Genital exam deferred with shared decision making Musculoskeletal:        General: No swelling, tenderness, deformity or signs of injury.     Cervical back: Normal range of motion and neck supple. No rigidity. No muscular tenderness.     Right lower leg: No edema.     Left lower leg: No edema.  Lymphadenopathy:     Cervical: No cervical adenopathy.  Skin:    General: Skin is warm and dry.     Capillary Refill: Capillary refill takes less than 2 seconds.     Coloration: Skin is not jaundiced or pale.     Findings: No  bruising, erythema, lesion or rash.  Neurological:     General: No focal deficit present.     Mental Status: He is alert and oriented to person, place, and time.     Cranial Nerves: No cranial nerve deficit.     Sensory: No sensory deficit.     Motor: No weakness.     Coordination: Coordination normal.     Gait: Gait normal.     Deep Tendon Reflexes: Reflexes normal.  Psychiatric:        Mood and Affect: Mood normal.        Behavior: Behavior normal.        Thought Content: Thought content normal.        Judgment: Judgment normal.    Results for orders placed or performed in visit on 09/30/20  HM DIABETES EYE EXAM  Result Value Ref Range   HM Diabetic Eye Exam No Retinopathy No Retinopathy      Assessment & Plan:   Problem List Items Addressed This Visit       Cardiovascular and Mediastinum   Hypertension    Under good control on current regimen. Continue current regimen. Continue to monitor. Call with any concerns. Refills given. Labs drawn today.         Relevant Medications   benazepril (LOTENSIN) 40 MG tablet   simvastatin (ZOCOR) 20 MG tablet   Other Relevant Orders   Comprehensive metabolic panel   CBC with Differential/Platelet   TSH   Microalbumin, Urine Waived     Endocrine   Diabetes mellitus without complication ([place])    Doing well with A1c of 6.4- continue current regimen. Continue to monitor. Call with any concerns.       Relevant Medications   benazepril (LOTENSIN) 40 MG tablet   metFORMIN (GLUCOPHAGE-XR) 500 MG 24  hr tablet   simvastatin (ZOCOR) 20 MG tablet   Other Relevant Orders   Comprehensive metabolic panel   CBC with Differential/Platelet   Bayer DCA Hb A1c Waived   Microalbumin, Urine Waived     Genitourinary   Benign localized hyperplasia of prostate with urinary obstruction    Under good control on current regimen. Continue current regimen. Continue to monitor. Call with any concerns. Refills given. Labs drawn today.          Relevant Orders   Comprehensive metabolic panel   CBC with Differential/Platelet   PSA   Urinalysis, Routine w reflex microscopic     Other   Hyperlipidemia    Under good control on current regimen. Continue current regimen. Continue to monitor. Call with any concerns. Refills given. Labs drawn today.         Relevant Medications   benazepril (LOTENSIN) 40 MG tablet   simvastatin (ZOCOR) 20 MG tablet   Other Relevant Orders   Comprehensive metabolic panel   CBC with Differential/Platelet   Lipid Panel w/o Chol/HDL Ratio   Other Visit Diagnoses     Routine general medical examination at a health care facility    -  Primary   Vaccines up to date. Screening labs checked today. Colonoscopy up to date. Continue to monitor. Continue diet and exercise. Call with any concerns.    Essential hypertension       Relevant Medications   benazepril (LOTENSIN) 40 MG tablet   simvastatin (ZOCOR) 20 MG tablet        Discussed aspirin prophylaxis for myocardial infarction prevention and decision was it was not indicated  LABORATORY TESTING:  Health maintenance labs ordered today as discussed above.   The natural history of prostate cancer and ongoing controversy regarding screening and potential treatment outcomes of prostate cancer has been discussed with the patient. The meaning of a false positive PSA and a false negative PSA has been discussed. He indicates understanding of the limitations of this screening test and wishes to proceed with screening PSA testing.   IMMUNIZATIONS:   - Tdap: Tetanus vaccination status reviewed: last tetanus booster within 10 years. - Influenza: Up to date - Pneumovax: Up to date - Prevnar: Up to date - COVID: Up to date - Shingrix vaccine: Up to date  SCREENING: - Colonoscopy: Up to date  Discussed with patient purpose of the colonoscopy is to detect colon cancer at curable precancerous or early stages   PATIENT COUNSELING:    Sexuality: Discussed  sexually transmitted diseases, partner selection, use of condoms, avoidance of unintended pregnancy  and contraceptive alternatives.   Advised to avoid cigarette smoking.  I discussed with the patient that most people either abstain from alcohol or drink within safe limits (<=14/week and <=4 drinks/occasion for males, <=7/weeks and <= 3 drinks/occasion for females) and that the risk for alcohol disorders and other health effects rises proportionally with the number of drinks per week and how often a drinker exceeds daily limits.  Discussed cessation/primary prevention of drug use and availability of treatment for abuse.   Diet: Encouraged to adjust caloric intake to maintain  or achieve ideal body weight, to reduce intake of dietary saturated fat and total fat, to limit sodium intake by avoiding high sodium foods and not adding table salt, and to maintain adequate dietary potassium and calcium preferably from fresh fruits, vegetables, and low-fat dairy products.    stressed the importance of regular exercise  Injury prevention: Discussed safety belts, safety helmets,  smoke detector, smoking near bedding or upholstery.   Dental health: Discussed importance of regular tooth brushing, flossing, and dental visits.   Follow up plan: NEXT PREVENTATIVE PHYSICAL DUE IN 1 YEAR. Return in about 6 months (around 10/01/2021).

## 2021-04-02 LAB — LIPID PANEL W/O CHOL/HDL RATIO
Cholesterol, Total: 160 mg/dL (ref 100–199)
HDL: 50 mg/dL (ref >39)
LDL Chol Calc (NIH): 90 mg/dL (ref 0–99)
Triglycerides: 110 mg/dL (ref 0–149)
VLDL Cholesterol Cal: 20 mg/dL (ref 5–40)

## 2021-04-02 LAB — COMPREHENSIVE METABOLIC PANEL
ALT: 16 IU/L (ref 0–44)
AST: 19 IU/L (ref 0–40)
Albumin/Globulin Ratio: 2 (ref 1.2–2.2)
Albumin: 4.7 g/dL (ref 3.7–4.7)
Alkaline Phosphatase: 61 IU/L (ref 44–121)
BUN/Creatinine Ratio: 20 (ref 10–24)
BUN: 23 mg/dL (ref 8–27)
Bilirubin Total: 0.7 mg/dL (ref 0.0–1.2)
CO2: 20 mmol/L (ref 20–29)
Calcium: 9.5 mg/dL (ref 8.6–10.2)
Chloride: 103 mmol/L (ref 96–106)
Creatinine, Ser: 1.16 mg/dL (ref 0.76–1.27)
Globulin, Total: 2.3 g/dL (ref 1.5–4.5)
Glucose: 112 mg/dL — ABNORMAL HIGH (ref 65–99)
Potassium: 5.1 mmol/L (ref 3.5–5.2)
Sodium: 141 mmol/L (ref 134–144)
Total Protein: 7 g/dL (ref 6.0–8.5)
eGFR: 67 mL/min/{1.73_m2} (ref >59)

## 2021-04-02 LAB — CBC WITH DIFFERENTIAL/PLATELET
Basophils Absolute: 0.1 10*3/uL (ref 0.0–0.2)
Basos: 2 %
EOS (ABSOLUTE): 0.3 10*3/uL (ref 0.0–0.4)
Eos: 6 %
Hematocrit: 43.7 % (ref 37.5–51.0)
Hemoglobin: 14.2 g/dL (ref 13.0–17.7)
Immature Grans (Abs): 0 10*3/uL (ref 0.0–0.1)
Immature Granulocytes: 0 %
Lymphocytes Absolute: 1.2 10*3/uL (ref 0.7–3.1)
Lymphs: 23 %
MCH: 32 pg (ref 26.6–33.0)
MCHC: 32.5 g/dL (ref 31.5–35.7)
MCV: 98 fL — ABNORMAL HIGH (ref 79–97)
Monocytes Absolute: 0.5 10*3/uL (ref 0.1–0.9)
Monocytes: 10 %
Neutrophils Absolute: 2.9 10*3/uL (ref 1.4–7.0)
Neutrophils: 59 %
Platelets: 260 10*3/uL (ref 150–450)
RBC: 4.44 x10E6/uL (ref 4.14–5.80)
RDW: 12.5 % (ref 11.6–15.4)
WBC: 5 10*3/uL (ref 3.4–10.8)

## 2021-04-02 LAB — TSH: TSH: 1.68 u[IU]/mL (ref 0.450–4.500)

## 2021-04-02 LAB — PSA: Prostate Specific Ag, Serum: 4.7 ng/mL — ABNORMAL HIGH (ref 0.0–4.0)

## 2021-04-27 ENCOUNTER — Telehealth: Payer: Self-pay

## 2021-04-27 NOTE — Telephone Encounter (Signed)
Copied from [place] [id number]. Topic: Complaint - Billing/Coding >> Apr 27, 2021  1:53 PM Yvette Rack wrote: DOS: 04/01/21 Details of complaint: Pt received bill for $20 for co-pay for appt for annual physical How would the patient like to see this issue resolved? Pt request billing be corrected and he be contacted to advised   Route to Engineer, building services.   Called pt to let him know our physical policy pt was upset stated he would like to speak to Practice admin as he feels he is being wrongfully billed. Pt contacted ins who stated there were codes that they did not cover pt stated he did not speak about the codes they were talking about as he only spoke to PCP for "9 mins"

## 2021-07-22 NOTE — Telephone Encounter (Signed)
Completed request and account settled.

## 2021-09-29 ENCOUNTER — Ambulatory Visit: Payer: Medicare PPO

## 2021-10-05 ENCOUNTER — Other Ambulatory Visit: Payer: Self-pay

## 2021-10-05 ENCOUNTER — Encounter: Payer: Self-pay | Admitting: Family Medicine

## 2021-10-05 ENCOUNTER — Ambulatory Visit: Payer: Medicare PPO | Admitting: Family Medicine

## 2021-10-05 VITALS — BP 138/83 | HR 70 | Temp 98.3°F | Ht 65.0 in | Wt 208.0 lb

## 2021-10-05 DIAGNOSIS — E7800 Pure hypercholesterolemia, unspecified: Secondary | ICD-10-CM

## 2021-10-05 DIAGNOSIS — I10 Essential (primary) hypertension: Secondary | ICD-10-CM | POA: Diagnosis not present

## 2021-10-05 DIAGNOSIS — Z23 Encounter for immunization: Secondary | ICD-10-CM

## 2021-10-05 DIAGNOSIS — N138 Other obstructive and reflux uropathy: Secondary | ICD-10-CM

## 2021-10-05 DIAGNOSIS — N401 Enlarged prostate with lower urinary tract symptoms: Secondary | ICD-10-CM

## 2021-10-05 DIAGNOSIS — E119 Type 2 diabetes mellitus without complications: Secondary | ICD-10-CM | POA: Diagnosis not present

## 2021-10-05 LAB — BAYER DCA HB A1C WAIVED: HB A1C (BAYER DCA - WAIVED): 6.6 % — ABNORMAL HIGH (ref 4.8–5.6)

## 2021-10-05 MED ORDER — SIMVASTATIN 20 MG PO TABS: 20.0000 mg | ORAL_TABLET | Freq: Every day | ORAL | 1 refills | Status: DC

## 2021-10-05 MED ORDER — BENAZEPRIL HCL 40 MG PO TABS: 40.0000 mg | ORAL_TABLET | Freq: Every day | ORAL | 1 refills | Status: DC

## 2021-10-05 MED ORDER — METFORMIN HCL ER 500 MG PO TB24: 1000.0000 mg | ORAL_TABLET | Freq: Two times a day (BID) | ORAL | 1 refills | Status: DC | PRN

## 2021-10-05 NOTE — Assessment & Plan Note (Signed)
Rechecking labs today. Await results. Treat as needed.  °

## 2021-10-05 NOTE — Assessment & Plan Note (Signed)
Under good control on current regimen. Continue current regimen. Continue to monitor. Call with any concerns. Refills given. Labs drawn today.   

## 2021-10-05 NOTE — Assessment & Plan Note (Signed)
Doing well with A1c of 6.6. Continue current regimen. Continue to monitor. Call with any concerns.

## 2021-10-05 NOTE — Progress Notes (Signed)
BP 138/83    Pulse 70    Temp 98.3 F (36.8 C) (Oral)    Ht 5' 5" (1.651 m)    Wt 208 lb (94.3 kg)    SpO2 95%    BMI 34.61 kg/m    Subjective:    Patient ID: Ralph Chapman, male    DOB: 09-13-1946, 75 y.o.   MRN: [id number]  HPI: Ralph Chapman is a 75 y.o. male  Chief Complaint  Patient presents with   Hypertension   Diabetes   Hyperlipidemia   Benign Prostatic Hypertrophy   HYPERTENSION / [place] Satisfied with current treatment? yes Duration of hypertension: chronic BP monitoring frequency: few times a week BP medication side effects: no Past BP meds: benazepril Duration of hyperlipidemia: chronic Cholesterol medication side effects: no Cholesterol supplements: none Past cholesterol medications: simvastatin Medication compliance: excellent compliance Aspirin: no Recent stressors: no Recurrent headaches: no Visual changes: no Palpitations: no Dyspnea: no Chest pain: no Lower extremity edema: no Dizzy/lightheaded: no  DIABETES Hypoglycemic episodes:no Polydipsia/polyuria: no Visual disturbance: no Chest pain: no Paresthesias: no Glucose Monitoring: yes Taking Insulin?: no Blood Pressure Monitoring: a few times a week Retinal Examination: Not up to Date Foot Exam: Up to Date Diabetic Education: Completed Pneumovax: Up to Date Influenza: Up to Date Aspirin: no  Relevant past medical, surgical, family and social history reviewed and updated as indicated. Interim medical history since our last visit reviewed. Allergies and medications reviewed and updated.  Review of Systems  Constitutional: Negative.   Respiratory: Negative.    Cardiovascular: Negative.   Musculoskeletal: Negative.   Psychiatric/Behavioral: Negative.     Per HPI unless specifically indicated above     Objective:    BP 138/83    Pulse 70    Temp 98.3 F (36.8 C) (Oral)    Ht 5' 5" (1.651 m)    Wt 208 lb (94.3 kg)    SpO2 95%    BMI 34.61 kg/m   Wt Readings from Last  3 Encounters:  10/05/21 208 lb (94.3 kg)  04/01/21 203 lb 12.8 oz (92.4 kg)  09/28/20 198 lb (89.8 kg)    Physical Exam Vitals and nursing note reviewed.  Constitutional:      General: He is not in acute distress.    Appearance: Normal appearance. He is not ill-appearing, toxic-appearing or diaphoretic.  HENT:     Head: Normocephalic and atraumatic.     Right Ear: External ear normal.     Left Ear: External ear normal.     Nose: Nose normal.     Mouth/Throat:     Mouth: Mucous membranes are moist.     Pharynx: Oropharynx is clear.  Eyes:     General: No scleral icterus.       Right eye: No discharge.        Left eye: No discharge.     Extraocular Movements: Extraocular movements intact.     Conjunctiva/sclera: Conjunctivae normal.     Pupils: Pupils are equal, round, and reactive to light.  Cardiovascular:     Rate and Rhythm: Normal rate and regular rhythm.     Pulses: Normal pulses.     Heart sounds: Normal heart sounds. No murmur heard.   No friction rub. No gallop.  Pulmonary:     Effort: Pulmonary effort is normal. No respiratory distress.     Breath sounds: Normal breath sounds. No stridor. No wheezing, rhonchi or rales.  Chest:     Chest wall:  No tenderness.  Musculoskeletal:        General: Normal range of motion.     Cervical back: Normal range of motion and neck supple.  Skin:    General: Skin is warm and dry.     Capillary Refill: Capillary refill takes less than 2 seconds.     Coloration: Skin is not jaundiced or pale.     Findings: No bruising, erythema, lesion or rash.  Neurological:     General: No focal deficit present.     Mental Status: He is alert and oriented to person, place, and time. Mental status is at baseline.  Psychiatric:        Mood and Affect: Mood normal.        Behavior: Behavior normal.        Thought Content: Thought content normal.        Judgment: Judgment normal.    Results for orders placed or performed in visit on 04/01/21   Microscopic Examination   Urine  Result Value Ref Range   WBC, UA 0-5 0 - 5 /hpf   RBC None seen 0 - 2 /hpf   Epithelial Cells (non renal) None seen 0 - 10 /hpf   Bacteria, UA None seen None seen/Few  Comprehensive metabolic panel  Result Value Ref Range   Glucose 112 (H) 65 - 99 mg/dL   BUN 23 8 - 27 mg/dL   Creatinine, Ser 1.16 0.76 - 1.27 mg/dL   eGFR 67 >59 mL/min/1.73   BUN/Creatinine Ratio 20 10 - 24   Sodium 141 134 - 144 mmol/L   Potassium 5.1 3.5 - 5.2 mmol/L   Chloride 103 96 - 106 mmol/L   CO2 20 20 - 29 mmol/L   Calcium 9.5 8.6 - 10.2 mg/dL   Total Protein 7.0 6.0 - 8.5 g/dL   Albumin 4.7 3.7 - 4.7 g/dL   Globulin, Total 2.3 1.5 - 4.5 g/dL   Albumin/Globulin Ratio 2.0 1.2 - 2.2   Bilirubin Total 0.7 0.0 - 1.2 mg/dL   Alkaline Phosphatase 61 44 - 121 IU/L   AST 19 0 - 40 IU/L   ALT 16 0 - 44 IU/L  CBC with Differential/Platelet  Result Value Ref Range   WBC 5.0 3.4 - 10.8 x10E3/uL   RBC 4.44 4.14 - 5.80 x10E6/uL   Hemoglobin 14.2 13.0 - 17.7 g/dL   Hematocrit 43.7 37.5 - 51.0 %   MCV 98 (H) 79 - 97 fL   MCH 32.0 26.6 - 33.0 pg   MCHC 32.5 31.5 - 35.7 g/dL   RDW 12.5 11.6 - 15.4 %   Platelets 260 150 - 450 x10E3/uL   Neutrophils 59 Not Estab. %   Lymphs 23 Not Estab. %   Monocytes 10 Not Estab. %   Eos 6 Not Estab. %   Basos 2 Not Estab. %   Neutrophils Absolute 2.9 1.4 - 7.0 x10E3/uL   Lymphocytes Absolute 1.2 0.7 - 3.1 x10E3/uL   Monocytes Absolute 0.5 0.1 - 0.9 x10E3/uL   EOS (ABSOLUTE) 0.3 0.0 - 0.4 x10E3/uL   Basophils Absolute 0.1 0.0 - 0.2 x10E3/uL   Immature Granulocytes 0 Not Estab. %   Immature Grans (Abs) 0.0 0.0 - 0.1 x10E3/uL  Lipid Panel w/o Chol/HDL Ratio  Result Value Ref Range   Cholesterol, Total 160 100 - 199 mg/dL   Triglycerides 110 0 - 149 mg/dL   HDL 50 >39 mg/dL   VLDL Cholesterol Cal 20 5 - 40 mg/dL   LDL  Chol Calc (NIH) 90 0 - 99 mg/dL  PSA  Result Value Ref Range   Prostate Specific Ag, Serum 4.7 (H) 0.0 - 4.0 ng/mL   TSH  Result Value Ref Range   TSH 1.680 0.450 - 4.500 uIU/mL  Urinalysis, Routine w reflex microscopic  Result Value Ref Range   Specific Gravity, UA 1.025 1.005 - 1.030   pH, UA 5.0 5.0 - 7.5   Color, UA Yellow Yellow   Appearance Ur Clear Clear   Leukocytes,UA Trace (A) Negative   Protein,UA Negative Negative/Trace   Glucose, UA Negative Negative   Ketones, UA Negative Negative   RBC, UA Negative Negative   Bilirubin, UA Negative Negative   Urobilinogen, Ur 0.2 0.2 - 1.0 mg/dL   Nitrite, UA Negative Negative   Microscopic Examination See below:   Bayer DCA Hb A1c Waived  Result Value Ref Range   HB A1C (BAYER DCA - WAIVED) 6.4 <7.0 %  Microalbumin, Urine Waived  Result Value Ref Range   Microalb, Ur Waived 30 (H) 0 - 19 mg/L   Creatinine, Urine Waived 300 10 - 300 mg/dL   Microalb/Creat Ratio <30 <30 mg/g      Assessment & Plan:   Problem List Items Addressed This Visit       Cardiovascular and Mediastinum   Hypertension    Under good control on current regimen. Continue current regimen. Continue to monitor. Call with any concerns. Refills given. Labs drawn today.       Relevant Medications   benazepril (LOTENSIN) 40 MG tablet   simvastatin (ZOCOR) 20 MG tablet   Other Relevant Orders   CBC with Differential/Platelet   Comprehensive metabolic panel     Endocrine   Diabetes mellitus without complication ([place]) - Primary    Doing well with A1c of 6.6. Continue current regimen. Continue to monitor. Call with any concerns.       Relevant Medications   benazepril (LOTENSIN) 40 MG tablet   metFORMIN (GLUCOPHAGE-XR) 500 MG 24 hr tablet   simvastatin (ZOCOR) 20 MG tablet   Other Relevant Orders   CBC with Differential/Platelet   Bayer DCA Hb A1c Waived   Comprehensive metabolic panel     Genitourinary   Benign localized hyperplasia of prostate with urinary obstruction    Rechecking labs today. Await results. Treat as needed.       Relevant Orders   PSA      Other   Hyperlipidemia    Under good control on current regimen. Continue current regimen. Continue to monitor. Call with any concerns. Refills given. Labs drawn today.       Relevant Medications   benazepril (LOTENSIN) 40 MG tablet   simvastatin (ZOCOR) 20 MG tablet   Other Relevant Orders   CBC with Differential/Platelet   Comprehensive metabolic panel   Lipid Panel w/o Chol/HDL Ratio   Other Visit Diagnoses     Essential hypertension       Relevant Medications   benazepril (LOTENSIN) 40 MG tablet   simvastatin (ZOCOR) 20 MG tablet   Need for influenza vaccination       Relevant Orders   Flu Vaccine QUAD High Dose(Fluad) (Completed)        Follow up plan: Return in about 6 months (around 04/05/2022), or physical.

## 2021-10-06 ENCOUNTER — Other Ambulatory Visit: Payer: Self-pay | Admitting: Family Medicine

## 2021-10-06 DIAGNOSIS — R972 Elevated prostate specific antigen [PSA]: Secondary | ICD-10-CM

## 2021-10-06 LAB — CBC WITH DIFFERENTIAL/PLATELET
Basophils Absolute: 0.1 10*3/uL (ref 0.0–0.2)
Basos: 2 %
EOS (ABSOLUTE): 0.4 10*3/uL (ref 0.0–0.4)
Eos: 6 %
Hematocrit: 42.3 % (ref 37.5–51.0)
Hemoglobin: 14.2 g/dL (ref 13.0–17.7)
Immature Grans (Abs): 0 10*3/uL (ref 0.0–0.1)
Immature Granulocytes: 0 %
Lymphocytes Absolute: 1.2 10*3/uL (ref 0.7–3.1)
Lymphs: 23 %
MCH: 31.5 pg (ref 26.6–33.0)
MCHC: 33.6 g/dL (ref 31.5–35.7)
MCV: 94 fL (ref 79–97)
Monocytes Absolute: 0.5 10*3/uL (ref 0.1–0.9)
Monocytes: 9 %
Neutrophils Absolute: 3.2 10*3/uL (ref 1.4–7.0)
Neutrophils: 60 %
Platelets: 318 10*3/uL (ref 150–450)
RBC: 4.51 x10E6/uL (ref 4.14–5.80)
RDW: 12.4 % (ref 11.6–15.4)
WBC: 5.4 10*3/uL (ref 3.4–10.8)

## 2021-10-06 LAB — COMPREHENSIVE METABOLIC PANEL
ALT: 12 IU/L (ref 0–44)
AST: 19 IU/L (ref 0–40)
Albumin/Globulin Ratio: 2 (ref 1.2–2.2)
Albumin: 4.5 g/dL (ref 3.7–4.7)
Alkaline Phosphatase: 68 IU/L (ref 44–121)
BUN/Creatinine Ratio: 15 (ref 10–24)
BUN: 18 mg/dL (ref 8–27)
Bilirubin Total: 0.6 mg/dL (ref 0.0–1.2)
CO2: 22 mmol/L (ref 20–29)
Calcium: 9.9 mg/dL (ref 8.6–10.2)
Chloride: 103 mmol/L (ref 96–106)
Creatinine, Ser: 1.24 mg/dL (ref 0.76–1.27)
Globulin, Total: 2.2 g/dL (ref 1.5–4.5)
Glucose: 114 mg/dL — ABNORMAL HIGH (ref 70–99)
Potassium: 4.7 mmol/L (ref 3.5–5.2)
Sodium: 141 mmol/L (ref 134–144)
Total Protein: 6.7 g/dL (ref 6.0–8.5)
eGFR: 61 mL/min/{1.73_m2} (ref >59)

## 2021-10-06 LAB — LIPID PANEL W/O CHOL/HDL RATIO
Cholesterol, Total: 170 mg/dL (ref 100–199)
HDL: 46 mg/dL (ref >39)
LDL Chol Calc (NIH): 92 mg/dL (ref 0–99)
Triglycerides: 187 mg/dL — ABNORMAL HIGH (ref 0–149)
VLDL Cholesterol Cal: 32 mg/dL (ref 5–40)

## 2021-10-06 LAB — PSA: Prostate Specific Ag, Serum: 5.4 ng/mL — ABNORMAL HIGH (ref 0.0–4.0)

## 2021-11-02 LAB — HM DIABETES EYE EXAM

## 2021-11-08 ENCOUNTER — Other Ambulatory Visit: Payer: Self-pay

## 2021-11-08 ENCOUNTER — Other Ambulatory Visit: Payer: Medicare PPO

## 2021-11-08 DIAGNOSIS — R972 Elevated prostate specific antigen [PSA]: Secondary | ICD-10-CM | POA: Diagnosis not present

## 2021-11-09 LAB — PSA: Prostate Specific Ag, Serum: 4.3 ng/mL — ABNORMAL HIGH (ref 0.0–4.0)

## 2021-11-16 ENCOUNTER — Ambulatory Visit (INDEPENDENT_AMBULATORY_CARE_PROVIDER_SITE_OTHER): Payer: Medicare PPO | Admitting: *Deleted

## 2021-11-16 VITALS — BP 118/79

## 2021-11-16 DIAGNOSIS — Z0000 Encounter for general adult medical examination without abnormal findings: Secondary | ICD-10-CM

## 2021-11-16 NOTE — Patient Instructions (Signed)
[name] , Thank you for taking time to come for your Medicare Wellness Visit. I appreciate your ongoing commitment to your health goals. Please review the following plan we discussed and let me know if I can assist you in the future.   Screening recommendations/referrals: Colonoscopy: up to date Recommended yearly ophthalmology/optometry visit for glaucoma screening and checkup Recommended yearly dental visit for hygiene and checkup  Vaccinations: Influenza vaccine: up to date Pneumococcal vaccine: up to date Tdap vaccine: up to date Shingles vaccine: up to date    Advanced directives: Education provided  Conditions/risks identified:   Next appointment: 04-05-2022 @ 8:00  [facility] 74 Years and Older, Male Preventive care refers to lifestyle choices and visits with your health care provider that can promote health and wellness. What does preventive care include? A yearly physical exam. This is also called an annual well check. Dental exams once or twice a year. Routine eye exams. Ask your health care provider how often you should have your eyes checked. Personal lifestyle choices, including: Daily care of your teeth and gums. Regular physical activity. Eating a healthy diet. Avoiding tobacco and drug use. Limiting alcohol use. Practicing safe sex. Taking low doses of aspirin every day. Taking vitamin and mineral supplements as recommended by your health care provider. What happens during an annual well check? The services and screenings done by your health care provider during your annual well check will depend on your age, overall health, lifestyle risk factors, and family history of disease. Counseling  Your health care provider may ask you questions about your: Alcohol use. Tobacco use. Drug use. Emotional well-being. Home and relationship well-being. Sexual activity. Eating habits. History of falls. Memory and ability to understand  (cognition). Work and work Statistician. Screening  You may have the following tests or measurements: Height, weight, and BMI. Blood pressure. Lipid and cholesterol levels. These may be checked every 5 years, or more frequently if you are over 34 years old. Skin check. Lung cancer screening. You may have this screening every year starting at age 20 if you have a 30-pack-year history of smoking and currently smoke or have quit within the past 15 years. Fecal occult blood test (FOBT) of the stool. You may have this test every year starting at age 24. Flexible sigmoidoscopy or colonoscopy. You may have a sigmoidoscopy every 5 years or a colonoscopy every 10 years starting at age 85. Prostate cancer screening. Recommendations will vary depending on your family history and other risks. Hepatitis C blood test. Hepatitis B blood test. Sexually transmitted disease (STD) testing. Diabetes screening. This is done by checking your blood sugar (glucose) after you have not eaten for a while (fasting). You may have this done every 1-3 years. Abdominal aortic aneurysm (AAA) screening. You may need this if you are a current or former smoker. Osteoporosis. You may be screened starting at age 20 if you are at high risk. Talk with your health care provider about your test results, treatment options, and if necessary, the need for more tests. Vaccines  Your health care provider may recommend certain vaccines, such as: Influenza vaccine. This is recommended every year. Tetanus, diphtheria, and acellular pertussis (Tdap, Td) vaccine. You may need a Td booster every 10 years. Zoster vaccine. You may need this after age 87. Pneumococcal 13-valent conjugate (PCV13) vaccine. One dose is recommended after age 60. Pneumococcal polysaccharide (PPSV23) vaccine. One dose is recommended after age 39. Talk to your health care provider about which screenings  and vaccines you need and how often you need them. This  information is not intended to replace advice given to you by your health care provider. Make sure you discuss any questions you have with your health care provider. Document Released: 10/23/2015 Document Revised: 06/15/2016 Document Reviewed: 07/28/2015 Elsevier Interactive Patient Education  2017 [place] Prevention in the Home Falls can cause injuries. They can happen to people of all ages. There are many things you can do to make your home safe and to help prevent falls. What can I do on the outside of my home? Regularly fix the edges of walkways and driveways and fix any cracks. Remove anything that might make you trip as you walk through a door, such as a raised step or threshold. Trim any bushes or trees on the path to your home. Use bright outdoor lighting. Clear any walking paths of anything that might make someone trip, such as rocks or tools. Regularly check to see if handrails are loose or broken. Make sure that both sides of any steps have handrails. Any raised decks and porches should have guardrails on the edges. Have any leaves, snow, or ice cleared regularly. Use sand or salt on walking paths during winter. Clean up any spills in your garage right away. This includes oil or grease spills. What can I do in the bathroom? Use night lights. Install grab bars by the toilet and in the tub and shower. Do not use towel bars as grab bars. Use non-skid mats or decals in the tub or shower. If you need to sit down in the shower, use a plastic, non-slip stool. Keep the floor dry. Clean up any water that spills on the floor as soon as it happens. Remove soap buildup in the tub or shower regularly. Attach bath mats securely with double-sided non-slip rug tape. Do not have throw rugs and other things on the floor that can make you trip. What can I do in the bedroom? Use night lights. Make sure that you have a light by your bed that is easy to reach. Do not use any sheets or  blankets that are too big for your bed. They should not hang down onto the floor. Have a firm chair that has side arms. You can use this for support while you get dressed. Do not have throw rugs and other things on the floor that can make you trip. What can I do in the kitchen? Clean up any spills right away. Avoid walking on wet floors. Keep items that you use a lot in easy-to-reach places. If you need to reach something above you, use a strong step stool that has a grab bar. Keep electrical cords out of the way. Do not use floor polish or wax that makes floors slippery. If you must use wax, use non-skid floor wax. Do not have throw rugs and other things on the floor that can make you trip. What can I do with my stairs? Do not leave any items on the stairs. Make sure that there are handrails on both sides of the stairs and use them. Fix handrails that are broken or loose. Make sure that handrails are as long as the stairways. Check any carpeting to make sure that it is firmly attached to the stairs. Fix any carpet that is loose or worn. Avoid having throw rugs at the top or bottom of the stairs. If you do have throw rugs, attach them to the floor with carpet tape.  Make sure that you have a light switch at the top of the stairs and the bottom of the stairs. If you do not have them, ask someone to add them for you. What else can I do to help prevent falls? Wear shoes that: Do not have high heels. Have rubber bottoms. Are comfortable and fit you well. Are closed at the toe. Do not wear sandals. If you use a stepladder: Make sure that it is fully opened. Do not climb a closed stepladder. Make sure that both sides of the stepladder are locked into place. Ask someone to hold it for you, if possible. Clearly mark and make sure that you can see: Any grab bars or handrails. First and last steps. Where the edge of each step is. Use tools that help you move around (mobility aids) if they are  needed. These include: Canes. Walkers. Scooters. Crutches. Turn on the lights when you go into a dark area. Replace any light bulbs as soon as they burn out. Set up your furniture so you have a clear path. Avoid moving your furniture around. If any of your floors are uneven, fix them. If there are any pets around you, be aware of where they are. Review your medicines with your doctor. Some medicines can make you feel dizzy. This can increase your chance of falling. Ask your doctor what other things that you can do to help prevent falls. This information is not intended to replace advice given to you by your health care provider. Make sure you discuss any questions you have with your health care provider. Document Released: 07/23/2009 Document Revised: 03/03/2016 Document Reviewed: 10/31/2014 Elsevier Interactive Patient Education  2017 [employer].

## 2021-11-16 NOTE — Progress Notes (Signed)
Subjective:   Ralph Chapman is a 76 y.o. male who presents for Medicare Annual/Subsequent preventive examination.  I connected with  Ivor Costa on 11/16/21 by a telephone  enabled telemedicine application and verified that I am speaking with the correct person using two identifiers.   I discussed the limitations of evaluation and management by telemedicine. The patient expressed understanding and agreed to proceed.  Patient location: home  Provider location: Tele-Health not in office    Review of Systems     Cardiac Risk Factors include: advanced age (>33men, >80 women);diabetes mellitus;male gender;hypertension     Objective:    Today's Vitals   11/16/21 0915  BP: 118/79   There is no height or weight on file to calculate BMI.  Advanced Directives 11/16/2021 09/28/2020 09/25/2019 09/13/2018 09/01/2017 05/23/2017 09/05/2016  Does Patient Have a Medical Advance Directive? No No No No No Yes;No No  Would patient like information on creating a medical advance directive? No - Patient declined - - Yes (MAU/Ambulatory/Procedural Areas - Information given) Yes (MAU/Ambulatory/Procedural Areas - Information given) - No - Patient declined    Current Medications (verified) Outpatient Encounter Medications as of 11/16/2021  Medication Sig   benazepril (LOTENSIN) 40 MG tablet Take 1 tablet (40 mg total) by mouth daily.   Cholecalciferol 125 MCG (5000 UT) TABS Take by mouth.   glucosamine-chondroitin 500-400 MG tablet Take by mouth.   glucose blood (ONE TOUCH ULTRA TEST) test strip USE TO CHECK BLOOD GLUCOSE TWICE DAILY   metFORMIN (GLUCOPHAGE-XR) 500 MG 24 hr tablet Take 2 tablets (1,000 mg total) by mouth 2 (two) times daily as needed.   simvastatin (ZOCOR) 20 MG tablet Take 1 tablet (20 mg total) by mouth at bedtime.   No facility-administered encounter medications on file as of 11/16/2021.    Allergies (verified) Aspirin, Niacin and related, and Tramadol   History: Past  Medical History:  Diagnosis Date   Diabetes mellitus without complication ([place])    Hyperlipidemia    Hypertension    Past Surgical History:  Procedure Laterality Date   APPENDECTOMY     COLONOSCOPY WITH PROPOFOL N/A 09/05/2016   Procedure: COLONOSCOPY WITH PROPOFOL;  Surgeon: Lucilla Lame, MD;  Location: [facility];  Service: Endoscopy;  Laterality: N/A;  Diabetic - oral meds   POLYPECTOMY  09/05/2016   Procedure: POLYPECTOMY;  Surgeon: Lucilla Lame, MD;  Location: [facility];  Service: Endoscopy;;   TONSILLECTOMY     Family History  Problem Relation Age of Onset   Cancer Mother    Heart attack Father 106   Heart disease Brother    Social History   Socioeconomic History   Marital status: Married    Spouse name: Not on file   Number of children: Not on file   Years of education: Not on file   Highest education level: Not on file  Occupational History   Not on file  Tobacco Use   Smoking status: Never   Smokeless tobacco: Never  Vaping Use   Vaping Use: Never used  Substance and Sexual Activity   Alcohol use: No   Drug use: No   Sexual activity: Not Currently  Other Topics Concern   Not on file  Social History Narrative   Not on file   Social Determinants of Health   Financial Resource Strain: Low Risk    Difficulty of Paying Living Expenses: Not hard at all  Food Insecurity: No Food Insecurity   Worried About Running Out of  Food in the Last Year: Never true   [place] in the Last Year: Never true  Transportation Needs: No Transportation Needs   Lack of Transportation (Medical): No   Lack of Transportation (Non-Medical): No  Physical Activity: Sufficiently Active   Days of Exercise per Week: 5 days   Minutes of Exercise per Session: 60 min  Stress: No Stress Concern Present   Feeling of Stress : Not at all  Social Connections: Socially Integrated   Frequency of Communication with Friends and Family: More than three times a week    Frequency of Social Gatherings with Friends and Family: Once a week   Attends Religious Services: More than 4 times per year   Active Member of [employer] or Organizations: Yes   Attends Archivist Meetings: 1 to 4 times per year   Marital Status: Married    Tobacco Counseling Counseling given: Not Answered   Clinical Intake:  Pre-visit preparation completed: Yes  Pain : No/denies pain     Nutritional Risks: None Diabetes: No  How often do you need to have someone help you when you read instructions, pamphlets, or other written materials from your doctor or pharmacy?: 1 - Never  Diabetic?   Yes  Nutrition Risk Assessment:  Has the patient had any N/V/D within the last 2 months?  No  Does the patient have any non-healing wounds?  No  Has the patient had any unintentional weight loss or weight gain?  No   Diabetes:  Is the patient diabetic?  Yes  If diabetic, was a CBG obtained today?  No  Did the patient bring in their glucometer from home?  No  How often do you monitor your CBG's? 2 x daily.   Financial Strains and Diabetes Management:  Are you having any financial strains with the device, your supplies or your medication? No .  Does the patient want to be seen by Chronic Care Management for management of their diabetes?  No  Would the patient like to be referred to a Nutritionist or for Diabetic Management?  No   Diabetic Exams:  Diabetic Eye Exam: Completed Pt has been advised about the importance in completing this exam.  Diabetic Foot Exam: . Pt has been advised about the importance in completing this exam.  Interpreter Needed?: No  Information entered by :: Leroy Kennedy LPN   Activities of Daily Living In your present state of health, do you have any difficulty performing the following activities: 11/16/2021 04/01/2021  Hearing? N N  Vision? N N  Difficulty concentrating or making decisions? N N  Walking or climbing stairs? N N  Dressing or bathing?  N N  Doing errands, shopping? N N  Preparing Food and eating ? N -  Using the Toilet? N -  In the past six months, have you accidently leaked urine? N -  Do you have problems with loss of bowel control? N -  Managing your Medications? N -  Managing your Finances? N -  Housekeeping or managing your Housekeeping? N -  Some recent data might be hidden    Patient Care Team: Valerie Roys, DO as PCP - General (Family Medicine) Lucilla Lame, MD as Consulting Physician (Gastroenterology)  Indicate any recent Medical Services you may have received from other than [facility] providers in the past year (date may be approximate).     Assessment:   This is a routine wellness examination for [place].  Hearing/Vision screen Hearing Screening - Comments::  No trouble hearing Vision Screening - Comments:: Up to date [facility]  Dietary issues and exercise activities discussed: Current Exercise Habits: Home exercise routine, Type of exercise: walking, Time (Minutes): 50, Frequency (Times/Week): 6, Weekly Exercise (Minutes/Week): 300, Exercise limited by: None identified   Goals Addressed             This Visit's Progress    DIET - INCREASE WATER INTAKE   On track    Recommend drinking at least 5-6 glasses of water a day      Weight (lb) < 200 lb (90.7 kg)       Keep Bp down       Depression Screen PHQ 2/9 Scores 11/16/2021 10/05/2021 04/01/2021 09/28/2020 09/28/2020 09/25/2019 09/13/2018  PHQ - 2 Score 0 0 0 0 0 0 0  PHQ- 9 Score 0 0 - - - - -    Fall Risk Fall Risk  11/16/2021 10/05/2021 04/01/2021 09/28/2020 09/28/2020  Falls in the past year? 0 0 0 0 0  Number falls in past yr: 0 0 0 - 0  Injury with Fall? 0 0 0 - 0  Risk for fall due to : - No Fall Risks No Fall Risks Medication side effect History of fall(s)  Follow up Falls evaluation completed;Falls prevention discussed Falls evaluation completed Falls evaluation completed Falls evaluation completed;Education provided;Falls prevention  discussed Falls evaluation completed    FALL RISK PREVENTION PERTAINING TO THE HOME:  Any stairs in or around the home? Yes  If so, are there any without handrails? No  Home free of loose throw rugs in walkways, pet beds, electrical cords, etc? Yes  Adequate lighting in your home to reduce risk of falls? Yes   ASSISTIVE DEVICES UTILIZED TO PREVENT FALLS:  Life alert? No  Use of a cane, walker or w/c? No  Grab bars in the bathroom? No  Shower chair or bench in shower? Yes  Elevated toilet seat or a handicapped toilet? Yes   TIMED UP AND GO:  Was the test performed? No .    Cognitive Function:  Normal cognitive status assessed by direct observation by this Nurse Health Advisor. No abnormalities found.       6CIT Screen 09/28/2020 09/13/2018 09/01/2017  What Year? 0 points 0 points 0 points  What month? 0 points 0 points 0 points  What time? 0 points 0 points 0 points  Count back from 20 0 points 0 points 0 points  Months in reverse 0 points 0 points 0 points  Repeat phrase 0 points 2 points 0 points  Total Score 0 2 0    Immunizations Immunization History  Administered Date(s) Administered   Fluad Quad(high Dose 65+) 09/28/2020, 10/05/2021   Influenza, High Dose Seasonal PF 08/01/2016, 09/01/2017, 09/13/2018   Influenza,inj,quad, With Preservative 08/01/2016, 09/01/2017, 09/13/2018, 06/26/2019   Influenza-Unspecified 08/17/2015, 08/01/2016, 09/01/2017, 09/13/2018   Moderna Sars-Covid-2 Vaccination 11/15/2019, 12/17/2019, 08/06/2020   Pneumococcal Conjugate-13 05/15/2014   Pneumococcal Polysaccharide-23 04/13/2013   Pneumococcal-Unspecified 03/21/2007, 04/10/2013   Tdap 05/15/2014   Zoster Recombinat (Shingrix) 11/23/2018, 12/25/2018, 04/01/2019   Zoster, Live 04/15/2013    TDAP status: Up to date  Flu Vaccine status: Up to date  Pneumococcal vaccine status: Up to date  Covid-19 vaccine status: Completed vaccines  Qualifies for Shingles Vaccine? No    Zostavax completed Yes   Shingrix Completed?: Yes  Screening Tests Health Maintenance  Topic Date Due   COVID-19 Vaccine (4 - Booster for Moderna series) 10/01/2020   COLONOSCOPY (Pts 45-53yrs Insurance  coverage will need to be confirmed)  09/23/2021   HEMOGLOBIN A1C  04/05/2022   FOOT EXAM  10/05/2022   OPHTHALMOLOGY EXAM  11/02/2022   TETANUS/TDAP  05/15/2024   Pneumonia Vaccine 67+ Years old  Completed   INFLUENZA VACCINE  Completed   Hepatitis C Screening  Completed   Zoster Vaccines- Shingrix  Completed   HPV VACCINES  Aged Out    Health Maintenance  Health Maintenance Due  Topic Date Due   COVID-19 Vaccine (4 - Booster for Moderna series) 10/01/2020   COLONOSCOPY (Pts 45-39yrs Insurance coverage will need to be confirmed)  09/23/2021    Colorectal cancer screening: Type of screening: Colonoscopy. Completed 2023. Repeat every 1 years  Lung Cancer Screening: (Low Dose CT Chest recommended if Age 61-80 years, 30 pack-year currently smoking OR have quit w/in 15years.) does not qualify.   Lung Cancer Screening Referral:   Additional Screening:  Hepatitis C Screening: does not qualify; Completed 2017  Vision Screening: Recommended annual ophthalmology exams for early detection of glaucoma and other disorders of the eye. Is the patient up to date with their annual eye exam?  Yes  Who is the provider or what is the name of the office in which the patient attends annual eye exams? [facility] If pt is not established with a provider, would they like to be referred to a provider to establish care? No .   Dental Screening: Recommended annual dental exams for proper oral hygiene  Community Resource Referral / Chronic Care Management: CRR required this visit?  No   CCM required this visit?  No      Plan:     I have personally reviewed and noted the following in the patients chart:   Medical and social history Use of alcohol, tobacco or illicit drugs  Current  medications and supplements including opioid prescriptions. Patient is not currently taking opioid prescriptions. Functional ability and status Nutritional status Physical activity Advanced directives List of other physicians Hospitalizations, surgeries, and ER visits in previous 12 months Vitals Screenings to include cognitive, depression, and falls Referrals and appointments  In addition, I have reviewed and discussed with patient certain preventive protocols, quality metrics, and best practice recommendations. A written personalized care plan for preventive services as well as general preventive health recommendations were provided to patient.     Leroy Kennedy, LPN   10/16/6158   Nurse Notes:

## 2021-11-17 DIAGNOSIS — Z85048 Personal history of other malignant neoplasm of rectum, rectosigmoid junction, and anus: Secondary | ICD-10-CM | POA: Diagnosis not present

## 2021-11-17 DIAGNOSIS — E669 Obesity, unspecified: Secondary | ICD-10-CM | POA: Diagnosis not present

## 2021-11-17 DIAGNOSIS — D124 Benign neoplasm of descending colon: Secondary | ICD-10-CM | POA: Diagnosis not present

## 2021-11-17 DIAGNOSIS — E119 Type 2 diabetes mellitus without complications: Secondary | ICD-10-CM | POA: Diagnosis not present

## 2021-11-17 DIAGNOSIS — Z85038 Personal history of other malignant neoplasm of large intestine: Secondary | ICD-10-CM | POA: Diagnosis not present

## 2021-11-17 DIAGNOSIS — I10 Essential (primary) hypertension: Secondary | ICD-10-CM | POA: Diagnosis not present

## 2021-11-17 DIAGNOSIS — Z1211 Encounter for screening for malignant neoplasm of colon: Secondary | ICD-10-CM | POA: Diagnosis not present

## 2021-11-17 DIAGNOSIS — Z86010 Personal history of colonic polyps: Secondary | ICD-10-CM | POA: Diagnosis not present

## 2021-11-17 DIAGNOSIS — D123 Benign neoplasm of transverse colon: Secondary | ICD-10-CM | POA: Diagnosis not present

## 2021-11-17 DIAGNOSIS — E785 Hyperlipidemia, unspecified: Secondary | ICD-10-CM | POA: Diagnosis not present

## 2021-11-17 DIAGNOSIS — K635 Polyp of colon: Secondary | ICD-10-CM | POA: Diagnosis not present

## 2021-11-17 DIAGNOSIS — I2510 Atherosclerotic heart disease of native coronary artery without angina pectoris: Secondary | ICD-10-CM | POA: Diagnosis not present

## 2021-12-06 DIAGNOSIS — E785 Hyperlipidemia, unspecified: Secondary | ICD-10-CM | POA: Diagnosis not present

## 2021-12-06 DIAGNOSIS — E119 Type 2 diabetes mellitus without complications: Secondary | ICD-10-CM | POA: Diagnosis not present

## 2021-12-06 DIAGNOSIS — M722 Plantar fascial fibromatosis: Secondary | ICD-10-CM | POA: Diagnosis not present

## 2021-12-06 DIAGNOSIS — Z6833 Body mass index (BMI) 33.0-33.9, adult: Secondary | ICD-10-CM | POA: Diagnosis not present

## 2021-12-06 DIAGNOSIS — I10 Essential (primary) hypertension: Secondary | ICD-10-CM | POA: Diagnosis not present

## 2021-12-06 DIAGNOSIS — I2510 Atherosclerotic heart disease of native coronary artery without angina pectoris: Secondary | ICD-10-CM | POA: Diagnosis not present

## 2021-12-06 DIAGNOSIS — C20 Malignant neoplasm of rectum: Secondary | ICD-10-CM | POA: Diagnosis not present

## 2021-12-30 ENCOUNTER — Other Ambulatory Visit: Payer: Self-pay | Admitting: Family Medicine

## 2021-12-30 DIAGNOSIS — E119 Type 2 diabetes mellitus without complications: Secondary | ICD-10-CM

## 2022-01-01 NOTE — Telephone Encounter (Signed)
Requested Prescriptions  ?Pending Prescriptions Disp Refills  ?? ACCU-CHEK GUIDE test strip [Pharmacy Med Name: ACCU-CHEK GUIDE TEST STRIP] 180 strip 1  ?  Sig: USE TO CHECK BLOOD GLUCOSE TWICE DAILY  ?  ? Endocrinology: Diabetes - Testing Supplies Passed - 12/30/2021  9:31 AM  ?  ?  Passed - Valid encounter within last 12 months  ?  Recent Outpatient Visits   ?      ? 2 months ago Diabetes mellitus without complication ([place])  ? [place], DO  ? 9 months ago Routine general medical examination at a health care facility  ? [place], DO  ? 1 year ago Need for influenza vaccination  ? [place], Megan P, DO  ? 1 year ago Essential hypertension  ? [place], DO  ? 2 years ago Essential hypertension  ? [place], [place] P, DO  ?  ?  ?Future Appointments   ?        ? In 3 months Wynetta Emery, Barb Merino, DO [facility]  ?  ? ?  ?  ?  ? ?

## 2022-03-31 ENCOUNTER — Other Ambulatory Visit: Payer: Self-pay | Admitting: Family Medicine

## 2022-03-31 DIAGNOSIS — E7800 Pure hypercholesterolemia, unspecified: Secondary | ICD-10-CM

## 2022-03-31 DIAGNOSIS — I10 Essential (primary) hypertension: Secondary | ICD-10-CM

## 2022-03-31 NOTE — Telephone Encounter (Signed)
Requested Prescriptions  Pending Prescriptions Disp Refills  . metFORMIN (GLUCOPHAGE-XR) 500 MG 24 hr tablet [Pharmacy Med Name: METFORMIN HCL ER 500 MG TABLET] 360 tablet 0    Sig: Take 2 tablets (1,000 mg total) by mouth 2 (two) times daily as needed.     Endocrinology:  Diabetes - Biguanides Failed - 03/31/2022 11:17 AM      Failed - B12 Level in normal range and within 720 days    No results found for: "VITAMINB12"       Passed - Cr in normal range and within 360 days    Creatinine, Ser  Date Value Ref Range Status  10/05/2021 1.24 0.76 - 1.27 mg/dL Final         Passed - HBA1C is between 0 and 7.9 and within 180 days    HB A1C (BAYER DCA - WAIVED)  Date Value Ref Range Status  10/05/2021 6.6 (H) 4.8 - 5.6 % Final    Comment:             Prediabetes: 5.7 - 6.4          Diabetes: >6.4          Glycemic control for adults with diabetes: <7.0          Passed - eGFR in normal range and within 360 days    GFR calc Af Amer  Date Value Ref Range Status  09/28/2020 72 >59 mL/min/1.73 Final    Comment:    **In accordance with recommendations from the NKF-ASN Task force,**   [facility] is in the process of updating its eGFR calculation to the   2021 CKD-EPI creatinine equation that estimates kidney function   without a race variable.    GFR calc non Af Amer  Date Value Ref Range Status  09/28/2020 63 >59 mL/min/1.73 Final   eGFR  Date Value Ref Range Status  10/05/2021 61 >59 mL/min/1.73 Final         Passed - Valid encounter within last 6 months    Recent Outpatient Visits          5 months ago Diabetes mellitus without complication ([place])   [place], Megan P, DO   12 months ago Routine general medical examination at a health care facility   [facility], [place], DO   1 year ago Need for influenza vaccination   [facility] Valerie Roys, DO   2 years ago Essential hypertension   [place], [place], DO   2 years ago Essential hypertension   [place], [place], DO      Future Appointments            In 5 days [place], Megan P, DO [place], [facility]           Passed - CBC within normal limits and completed in the last 12 months    WBC  Date Value Ref Range Status  10/05/2021 5.4 3.4 - 10.8 x10E3/uL Final   RBC  Date Value Ref Range Status  10/05/2021 4.51 4.14 - 5.80 x10E6/uL Final   Hemoglobin  Date Value Ref Range Status  10/05/2021 14.2 13.0 - 17.7 g/dL Final   Hematocrit  Date Value Ref Range Status  10/05/2021 42.3 37.5 - 51.0 % Final   MCHC  Date Value Ref Range Status  10/05/2021 33.6 31.5 - 35.7 g/dL Final   [facility]  Date Value Ref Range Status  10/05/2021  31.5 26.6 - 33.0 pg Final   MCV  Date Value Ref Range Status  10/05/2021 94 79 - 97 fL Final   No results found for: "PLTCOUNTKUC", "LABPLAT", "POCPLA" RDW  Date Value Ref Range Status  10/05/2021 12.4 11.6 - 15.4 % Final         . benazepril (LOTENSIN) 40 MG tablet [Pharmacy Med Name: BENAZEPRIL HCL 40 MG TABLET] 90 tablet 0    Sig: Take 1 tablet (40 mg total) by mouth daily.     Cardiovascular:  ACE Inhibitors Passed - 03/31/2022 11:17 AM      Passed - Cr in normal range and within 180 days    Creatinine, Ser  Date Value Ref Range Status  10/05/2021 1.24 0.76 - 1.27 mg/dL Final         Passed - K in normal range and within 180 days    Potassium  Date Value Ref Range Status  10/05/2021 4.7 3.5 - 5.2 mmol/L Final         Passed - Patient is not pregnant      Passed - Last BP in normal range    BP Readings from Last 1 Encounters:  11/16/21 118/79         Passed - Valid encounter within last 6 months    Recent Outpatient Visits          5 months ago Diabetes mellitus without complication ([place])   [place], Megan P, DO   12 months ago Routine general medical examination at a health care facility   [place], [place], DO   1 year ago Need for influenza vaccination   [facility] Valerie Roys, DO   2 years ago Essential hypertension   [place], [place], DO   2 years ago Essential hypertension   [place], [place], DO      Future Appointments            In 5 days Wynetta Emery, Barb Merino, DO [employer], [facility]           . simvastatin (ZOCOR) 20 MG tablet [Pharmacy Med Name: SIMVASTATIN 20 MG TABLET] 90 tablet 0    Sig: Take 1 tablet (20 mg total) by mouth at bedtime.     Cardiovascular:  Antilipid - Statins Failed - 03/31/2022 11:17 AM      Failed - Lipid Panel in normal range within the last 12 months    Cholesterol, Total  Date Value Ref Range Status  10/05/2021 170 100 - 199 mg/dL Final   Cholesterol Piccolo, Waived  Date Value Ref Range Status  12/27/2017 168 <200 mg/dL Final    Comment:                            Desirable                <200                         Borderline High      200- 239                         High                     >239    LDL Chol Calc (NIH)  Date  Value Ref Range Status  10/05/2021 92 0 - 99 mg/dL Final   HDL  Date Value Ref Range Status  10/05/2021 46 >39 mg/dL Final   Triglycerides  Date Value Ref Range Status  10/05/2021 187 (H) 0 - 149 mg/dL Final   Triglycerides Piccolo,Waived  Date Value Ref Range Status  12/27/2017 241 (H) <150 mg/dL Final    Comment:                            Normal                   <150                         Borderline High     150 - 199                         High                200 - 499                         Very High                >499          Passed - Patient is not pregnant      Passed - Valid encounter within last 12 months    Recent Outpatient Visits          5 months ago Diabetes mellitus without complication ([place])   [place], Megan P, DO   12 months ago Routine  general medical examination at a health care facility   [place], [place], DO   1 year ago Need for influenza vaccination   [facility] Valerie Roys, DO   2 years ago Essential hypertension   [place], [place], DO   2 years ago Essential hypertension   [place], [place], DO      Future Appointments            In 5 days Wynetta Emery, Barb Merino, DO [employer], [facility]

## 2022-04-05 ENCOUNTER — Ambulatory Visit (INDEPENDENT_AMBULATORY_CARE_PROVIDER_SITE_OTHER): Payer: Medicare PPO | Admitting: Family Medicine

## 2022-04-05 ENCOUNTER — Encounter: Payer: Self-pay | Admitting: Family Medicine

## 2022-04-05 VITALS — BP 130/74 | HR 68 | Temp 98.1°F | Ht 66.0 in | Wt 207.8 lb

## 2022-04-05 DIAGNOSIS — E7800 Pure hypercholesterolemia, unspecified: Secondary | ICD-10-CM

## 2022-04-05 DIAGNOSIS — R972 Elevated prostate specific antigen [PSA]: Secondary | ICD-10-CM | POA: Diagnosis not present

## 2022-04-05 DIAGNOSIS — I10 Essential (primary) hypertension: Secondary | ICD-10-CM | POA: Diagnosis not present

## 2022-04-05 DIAGNOSIS — Z85038 Personal history of other malignant neoplasm of large intestine: Secondary | ICD-10-CM

## 2022-04-05 DIAGNOSIS — E118 Type 2 diabetes mellitus with unspecified complications: Secondary | ICD-10-CM

## 2022-04-05 DIAGNOSIS — E119 Type 2 diabetes mellitus without complications: Secondary | ICD-10-CM | POA: Diagnosis not present

## 2022-04-05 DIAGNOSIS — Z0000 Encounter for general adult medical examination without abnormal findings: Secondary | ICD-10-CM

## 2022-04-05 DIAGNOSIS — R8281 Pyuria: Secondary | ICD-10-CM | POA: Diagnosis not present

## 2022-04-05 LAB — URINALYSIS, ROUTINE W REFLEX MICROSCOPIC
Bilirubin, UA: NEGATIVE
Glucose, UA: NEGATIVE
Ketones, UA: NEGATIVE
Nitrite, UA: NEGATIVE
RBC, UA: NEGATIVE
Specific Gravity, UA: >1.03 — ABNORMAL HIGH (ref 1.005–1.030)
Urobilinogen, Ur: 0.2 mg/dL (ref 0.2–1.0)
pH, UA: 5.5 (ref 5.0–7.5)

## 2022-04-05 LAB — MICROALBUMIN, URINE WAIVED
Creatinine, Urine Waived: 300 mg/dL (ref 10–300)
Microalb, Ur Waived: 30 mg/L — ABNORMAL HIGH (ref 0–19)
Microalb/Creat Ratio: <30 mg/g (ref <30)

## 2022-04-05 LAB — MICROSCOPIC EXAMINATION: RBC, Urine: NONE SEEN /hpf (ref 0–2)

## 2022-04-05 LAB — BAYER DCA HB A1C WAIVED: HB A1C (BAYER DCA - WAIVED): 6.6 % — ABNORMAL HIGH (ref 4.8–5.6)

## 2022-04-05 MED ORDER — METFORMIN HCL ER 500 MG PO TB24: 1000.0000 mg | ORAL_TABLET | Freq: Two times a day (BID) | ORAL | 1 refills | Status: DC | PRN

## 2022-04-05 MED ORDER — BENAZEPRIL HCL 40 MG PO TABS: 40.0000 mg | ORAL_TABLET | Freq: Every day | ORAL | 1 refills | Status: DC

## 2022-04-05 MED ORDER — ACCU-CHEK GUIDE VI STRP: ORAL_STRIP | 3 refills | Status: DC

## 2022-04-05 MED ORDER — SIMVASTATIN 20 MG PO TABS: 20.0000 mg | ORAL_TABLET | Freq: Every day | ORAL | 1 refills | Status: DC

## 2022-04-05 NOTE — Progress Notes (Signed)
BP 130/74   Pulse 68   Temp 98.1 F (36.7 C)   Ht 5\' 6"  (1.676 m)   Wt 207 lb 12.8 oz (94.3 kg)   SpO2 96%   BMI 33.54 kg/m    Subjective:    Patient ID: Ralph Chapman, male    DOB: [birth year], 76 y.o.   MRN: [id number]  HPI: Ralph Chapman is a 76 y.o. male presenting on 04/05/2022 for comprehensive medical examination. Current medical complaints include:  HYPERTENSION / HYPERLIPIDEMIA Satisfied with current treatment? yes Duration of hypertension: chronic BP monitoring frequency: not checking BP medication side effects: no Past BP meds: benazepril Duration of hyperlipidemia: chronic Cholesterol medication side effects: no Cholesterol supplements: none Past cholesterol medications: simvastatin Medication compliance: excellent compliance Aspirin: no Recent stressors: no Recurrent headaches: no Visual changes: no Palpitations: no Dyspnea: no Chest pain: no Lower extremity edema: no Dizzy/lightheaded: no  DIABETES Hypoglycemic episodes:no Polydipsia/polyuria: no Visual disturbance: no Chest pain: no Paresthesias: no Glucose Monitoring: yes  Accucheck frequency: BID Taking Insulin?: no Blood Pressure Monitoring: not checking Retinal Examination: Up to Date Foot Exam: Up to Date Diabetic Education:  yes Pneumovax: Up to Date Influenza: Up to Date Aspirin: no  Has been noticing a very fine tremor with close work.  He currently lives with: wife Interim Problems from his last visit: no  Depression Screen done today and results listed below:     04/05/2022    8:12 AM 11/16/2021    9:20 AM 10/05/2021    8:40 AM 04/01/2021    8:08 AM 09/28/2020    2:34 PM  Depression screen PHQ 2/9  Decreased Interest 0 0 0 0 0  Down, Depressed, Hopeless 0 0 0 0 0  PHQ - 2 Score 0 0 0 0 0  Altered sleeping 0 0 0    Tired, decreased energy 0 0 0    Change in appetite 0 0 0    Feeling bad or failure about yourself  0 0 0    Trouble concentrating 0 0 0    Moving slowly  or fidgety/restless 0 0 0    Suicidal thoughts 0 0 0    PHQ-9 Score 0 0 0    Difficult doing work/chores Not difficult at all Not difficult at all Not difficult at all      Past Medical History:  Past Medical History:  Diagnosis Date   Diabetes mellitus without complication (HCC)    Hyperlipidemia    Hypertension     Surgical History:  Past Surgical History:  Procedure Laterality Date   APPENDECTOMY     COLONOSCOPY WITH PROPOFOL N/A 09/05/2016   Procedure: COLONOSCOPY WITH PROPOFOL;  Surgeon: Midge Minium, MD;  Location: [facility];  Service: Endoscopy;  Laterality: N/A;  Diabetic - oral meds   POLYPECTOMY  09/05/2016   Procedure: POLYPECTOMY;  Surgeon: Midge Minium, MD;  Location: [facility];  Service: Endoscopy;;   TONSILLECTOMY      Medications:  Current Outpatient Medications on File Prior to Visit  Medication Sig   Cholecalciferol 125 MCG (5000 UT) TABS Take by mouth.   glucosamine-chondroitin 500-400 MG tablet Take by mouth.   No current facility-administered medications on file prior to visit.    Allergies:  Allergies  Allergen Reactions   Aspirin Hives, Shortness Of Breath and Swelling    Lips swell   Niacin And Related Swelling   Tramadol Nausea And Vomiting    Social History:  Social History   Socioeconomic  History   Marital status: Married    Spouse name: Not on file   Number of children: Not on file   Years of education: Not on file   Highest education level: Not on file  Occupational History   Not on file  Tobacco Use   Smoking status: Never   Smokeless tobacco: Never  Vaping Use   Vaping Use: Never used  Substance and Sexual Activity   Alcohol use: No   Drug use: No   Sexual activity: Not Currently  Other Topics Concern   Not on file  Social History Narrative   Not on file   Social Determinants of Health   Financial Resource Strain: Low Risk  (11/16/2021)   Overall Financial Resource Strain (CARDIA)    Difficulty of  Paying Living Expenses: Not hard at all  Food Insecurity: No Food Insecurity (11/16/2021)   Hunger Vital Sign    Worried About Running Out of Food in the Last Year: Never true    Ran Out of Food in the Last Year: Never true  Transportation Needs: No Transportation Needs (11/16/2021)   PRAPARE - Administrator, Civil Service (Medical): No    Lack of Transportation (Non-Medical): No  Physical Activity: Sufficiently Active (11/16/2021)   Exercise Vital Sign    Days of Exercise per Week: 5 days    Minutes of Exercise per Session: 60 min  Stress: No Stress Concern Present (11/16/2021)   [employer] of Occupational Health - Occupational Stress Questionnaire    Feeling of Stress : Not at all  Social Connections: Socially Integrated (11/16/2021)   Social Connection and Isolation Panel [NHANES]    Frequency of Communication with Friends and Family: More than three times a week    Frequency of Social Gatherings with Friends and Family: Once a week    Attends Religious Services: More than 4 times per year    Active Member of [employer] or Organizations: Yes    Attends Banker Meetings: 1 to 4 times per year    Marital Status: Married  Catering manager Violence: Not At Risk (11/16/2021)   Humiliation, Afraid, Rape, and Kick questionnaire    Fear of Current or Ex-Partner: No    Emotionally Abused: No    Physically Abused: No    Sexually Abused: No   Social History   Tobacco Use  Smoking Status Never  Smokeless Tobacco Never   Social History   Substance and Sexual Activity  Alcohol Use No    Family History:  Family History  Problem Relation Age of Onset   Cancer Mother    Heart attack Father 79   Heart disease Brother     Past medical history, surgical history, medications, allergies, family history and social history reviewed with patient today and changes made to appropriate areas of the chart.   Review of Systems  Constitutional: Negative.   HENT: Negative.     Eyes: Negative.   Respiratory: Negative.    Cardiovascular:  Positive for palpitations. Negative for chest pain, orthopnea, claudication, leg swelling and PND.  Gastrointestinal: Negative.   Genitourinary: Negative.   Musculoskeletal: Negative.   Skin: Negative.   Neurological:  Positive for tremors. Negative for dizziness, tingling, sensory change, speech change, focal weakness, seizures, loss of consciousness, weakness and headaches.  Endo/Heme/Allergies:  Negative for environmental allergies and polydipsia. Bruises/bleeds easily.  Psychiatric/Behavioral: Negative.     All other ROS negative except what is listed above and in the HPI.  Objective:    BP 130/74   Pulse 68   Temp 98.1 F (36.7 C)   Ht 5\' 6"  (1.676 m)   Wt 207 lb 12.8 oz (94.3 kg)   SpO2 96%   BMI 33.54 kg/m   Wt Readings from Last 3 Encounters:  04/05/22 207 lb 12.8 oz (94.3 kg)  10/05/21 208 lb (94.3 kg)  04/01/21 203 lb 12.8 oz (92.4 kg)    Physical Exam Vitals and nursing note reviewed.  Constitutional:      General: He is not in acute distress.    Appearance: Normal appearance. He is obese. He is not ill-appearing, toxic-appearing or diaphoretic.  HENT:     Head: Normocephalic and atraumatic.     Right Ear: Tympanic membrane, ear canal and external ear normal. There is no impacted cerumen.     Left Ear: Tympanic membrane, ear canal and external ear normal. There is no impacted cerumen.     Nose: Nose normal. No congestion or rhinorrhea.     Mouth/Throat:     Mouth: Mucous membranes are moist.     Pharynx: Oropharynx is clear. No oropharyngeal exudate or posterior oropharyngeal erythema.  Eyes:     General: No scleral icterus.       Right eye: No discharge.        Left eye: No discharge.     Extraocular Movements: Extraocular movements intact.     Conjunctiva/sclera: Conjunctivae normal.     Pupils: Pupils are equal, round, and reactive to light.  Neck:     Vascular: No carotid bruit.   Cardiovascular:     Rate and Rhythm: Normal rate and regular rhythm.     Pulses: Normal pulses.     Heart sounds: No murmur heard.    No friction rub. No gallop.  Pulmonary:     Effort: Pulmonary effort is normal. No respiratory distress.     Breath sounds: Normal breath sounds. No stridor. No wheezing, rhonchi or rales.  Chest:     Chest wall: No tenderness.  Abdominal:     General: Abdomen is flat. Bowel sounds are normal. There is no distension.     Palpations: Abdomen is soft. There is no mass.     Tenderness: There is no abdominal tenderness. There is no right CVA tenderness, left CVA tenderness, guarding or rebound.     Hernia: No hernia is present.  Genitourinary:    Comments: Genital exam deferred with shared decision making Musculoskeletal:        General: No swelling, tenderness, deformity or signs of injury.     Cervical back: Normal range of motion and neck supple. No rigidity. No muscular tenderness.     Right lower leg: No edema.     Left lower leg: No edema.  Lymphadenopathy:     Cervical: No cervical adenopathy.  Skin:    General: Skin is warm and dry.     Capillary Refill: Capillary refill takes less than 2 seconds.     Coloration: Skin is not jaundiced or pale.     Findings: No bruising, erythema, lesion or rash.  Neurological:     General: No focal deficit present.     Mental Status: He is alert and oriented to person, place, and time.     Cranial Nerves: No cranial nerve deficit.     Sensory: No sensory deficit.     Motor: No weakness.     Coordination: Coordination normal.     Gait: Gait normal.  Deep Tendon Reflexes: Reflexes normal.  Psychiatric:        Mood and Affect: Mood normal.        Behavior: Behavior normal.        Thought Content: Thought content normal.        Judgment: Judgment normal.     Results for orders placed or performed in visit on 04/05/22  Microscopic Examination   BLD  Result Value Ref Range   WBC, UA 0-5 0 - 5 /hpf    RBC None seen 0 - 2 /hpf   Epithelial Cells (non renal) 0-10 0 - 10 /hpf   Mucus, UA Present (A) Not Estab.   Bacteria, UA Few (A) None seen/Few  Urinalysis, Routine w reflex microscopic  Result Value Ref Range   Specific Gravity, UA >1.030 (H) 1.005 - 1.030   pH, UA 5.5 5.0 - 7.5   Color, UA Yellow Yellow   Appearance Ur Clear Clear   Leukocytes,UA 1+ (A) Negative   Protein,UA Trace (A) Negative/Trace   Glucose, UA Negative Negative   Ketones, UA Negative Negative   RBC, UA Negative Negative   Bilirubin, UA Negative Negative   Urobilinogen, Ur 0.2 0.2 - 1.0 mg/dL   Nitrite, UA Negative Negative   Microscopic Examination See below:   Bayer DCA Hb A1c Waived  Result Value Ref Range   HB A1C (BAYER DCA - WAIVED) 6.6 (H) 4.8 - 5.6 %  Microalbumin, Urine Waived  Result Value Ref Range   Microalb, Ur Waived 30 (H) 0 - 19 mg/L   Creatinine, Urine Waived 300 10 - 300 mg/dL   Microalb/Creat Ratio <30 <30 mg/g      Assessment & Plan:   Problem List Items Addressed This Visit       Cardiovascular and Mediastinum   Hypertension    Under good control on current regimen. Continue current regimen. Continue to monitor. Call with any concerns. Refills given. Labs drawn today.       Relevant Medications   benazepril (LOTENSIN) 40 MG tablet   simvastatin (ZOCOR) 20 MG tablet   Other Relevant Orders   Comprehensive metabolic panel   CBC with Differential/Platelet   TSH   Urinalysis, Routine w reflex microscopic (Completed)   Microalbumin, Urine Waived (Completed)     Endocrine   Controlled diabetes mellitus type 2 with complications (HCC)   Relevant Medications   benazepril (LOTENSIN) 40 MG tablet   simvastatin (ZOCOR) 20 MG tablet   metFORMIN (GLUCOPHAGE-XR) 500 MG 24 hr tablet   glucose blood (ACCU-CHEK GUIDE) test strip   Other Relevant Orders   Comprehensive metabolic panel   CBC with Differential/Platelet   Bayer DCA Hb A1c Waived (Completed)   Microalbumin, Urine  Waived (Completed)   Microscopic Examination (Completed)     Other   Hyperlipidemia    Under good control on current regimen. Continue current regimen. Continue to monitor. Call with any concerns. Refills given. Labs drawn today.       Relevant Medications   benazepril (LOTENSIN) 40 MG tablet   simvastatin (ZOCOR) 20 MG tablet   Other Relevant Orders   Comprehensive metabolic panel   CBC with Differential/Platelet   Lipid Panel w/o Chol/HDL Ratio   History of colon cancer    Had his colonoscopy in February. Doing well. Continue to follow with GI and surgeon. Call with any concerns.       Elevated prostate specific antigen (PSA)    Would like to continue to check PSAs. Drawn today.  Await results. Treat as needed.       Relevant Orders   Comprehensive metabolic panel   CBC with Differential/Platelet   PSA   Other Visit Diagnoses     Routine general medical examination at a health care facility    -  Primary   Vaccines up to date. Screening labs checked today. Colonoscopy up to date. Continue diet and exercise. Call with any concerns.    Essential hypertension       Relevant Medications   benazepril (LOTENSIN) 40 MG tablet   simvastatin (ZOCOR) 20 MG tablet   Pyuria       Checking urine culture. Await results.    Relevant Orders   Urine Culture        LABORATORY TESTING:  Health maintenance labs ordered today as discussed above.   The natural history of prostate cancer and ongoing controversy regarding screening and potential treatment outcomes of prostate cancer has been discussed with the patient. The meaning of a false positive PSA and a false negative PSA has been discussed. He indicates understanding of the limitations of this screening test and wishes to proceed with screening PSA testing.   IMMUNIZATIONS:   - Tdap: Tetanus vaccination status reviewed: last tetanus booster within 10 years. - Influenza: Up to date - Pneumovax: Up to date - Prevnar: Up to  date - COVID: Up to date - HPV: Not applicable - Shingrix vaccine: Up to date  SCREENING: - Colonoscopy: Up to date  Discussed with patient purpose of the colonoscopy is to detect colon cancer at curable precancerous or early stages   PATIENT COUNSELING:    Sexuality: Discussed sexually transmitted diseases, partner selection, use of condoms, avoidance of unintended pregnancy  and contraceptive alternatives.   Advised to avoid cigarette smoking.  I discussed with the patient that most people either abstain from alcohol or drink within safe limits (<=14/week and <=4 drinks/occasion for males, <=7/weeks and <= 3 drinks/occasion for females) and that the risk for alcohol disorders and other health effects rises proportionally with the number of drinks per week and how often a drinker exceeds daily limits.  Discussed cessation/primary prevention of drug use and availability of treatment for abuse.   Diet: Encouraged to adjust caloric intake to maintain  or achieve ideal body weight, to reduce intake of dietary saturated fat and total fat, to limit sodium intake by avoiding high sodium foods and not adding table salt, and to maintain adequate dietary potassium and calcium preferably from fresh fruits, vegetables, and low-fat dairy products.    stressed the importance of regular exercise  Injury prevention: Discussed safety belts, safety helmets, smoke detector, smoking near bedding or upholstery.   Dental health: Discussed importance of regular tooth brushing, flossing, and dental visits.   Follow up plan: NEXT PREVENTATIVE PHYSICAL DUE IN 1 YEAR. Return in about 6 months (around 10/05/2022).

## 2022-04-05 NOTE — Assessment & Plan Note (Signed)
Would like to continue to check PSAs. Drawn today. Await results. Treat as needed.

## 2022-04-06 LAB — COMPREHENSIVE METABOLIC PANEL
ALT: 12 IU/L (ref 0–44)
AST: 17 IU/L (ref 0–40)
Albumin/Globulin Ratio: 2.2 (ref 1.2–2.2)
Albumin: 4.6 g/dL (ref 3.7–4.7)
Alkaline Phosphatase: 60 IU/L (ref 44–121)
BUN/Creatinine Ratio: 17 (ref 10–24)
BUN: 21 mg/dL (ref 8–27)
Bilirubin Total: 0.5 mg/dL (ref 0.0–1.2)
CO2: 19 mmol/L — ABNORMAL LOW (ref 20–29)
Calcium: 9.6 mg/dL (ref 8.6–10.2)
Chloride: 105 mmol/L (ref 96–106)
Creatinine, Ser: 1.27 mg/dL (ref 0.76–1.27)
Globulin, Total: 2.1 g/dL (ref 1.5–4.5)
Glucose: 115 mg/dL — ABNORMAL HIGH (ref 70–99)
Potassium: 5 mmol/L (ref 3.5–5.2)
Sodium: 140 mmol/L (ref 134–144)
Total Protein: 6.7 g/dL (ref 6.0–8.5)
eGFR: 59 mL/min/{1.73_m2} — ABNORMAL LOW (ref >59)

## 2022-04-06 LAB — CBC WITH DIFFERENTIAL/PLATELET
Basophils Absolute: 0.1 10*3/uL (ref 0.0–0.2)
Basos: 2 %
EOS (ABSOLUTE): 0.4 10*3/uL (ref 0.0–0.4)
Eos: 7 %
Hematocrit: 40.7 % (ref 37.5–51.0)
Hemoglobin: 13.6 g/dL (ref 13.0–17.7)
Immature Grans (Abs): 0 10*3/uL (ref 0.0–0.1)
Immature Granulocytes: 0 %
Lymphocytes Absolute: 1.3 10*3/uL (ref 0.7–3.1)
Lymphs: 24 %
MCH: 31.8 pg (ref 26.6–33.0)
MCHC: 33.4 g/dL (ref 31.5–35.7)
MCV: 95 fL (ref 79–97)
Monocytes Absolute: 0.5 10*3/uL (ref 0.1–0.9)
Monocytes: 9 %
Neutrophils Absolute: 3.1 10*3/uL (ref 1.4–7.0)
Neutrophils: 58 %
Platelets: 275 10*3/uL (ref 150–450)
RBC: 4.28 x10E6/uL (ref 4.14–5.80)
RDW: 12.8 % (ref 11.6–15.4)
WBC: 5.5 10*3/uL (ref 3.4–10.8)

## 2022-04-06 LAB — PSA: Prostate Specific Ag, Serum: 4.9 ng/mL — ABNORMAL HIGH (ref 0.0–4.0)

## 2022-04-06 LAB — LIPID PANEL W/O CHOL/HDL RATIO
Cholesterol, Total: 153 mg/dL (ref 100–199)
HDL: 42 mg/dL (ref >39)
LDL Chol Calc (NIH): 84 mg/dL (ref 0–99)
Triglycerides: 152 mg/dL — ABNORMAL HIGH (ref 0–149)
VLDL Cholesterol Cal: 27 mg/dL (ref 5–40)

## 2022-04-06 LAB — TSH: TSH: 2.27 u[IU]/mL (ref 0.450–4.500)

## 2022-04-09 LAB — URINE CULTURE

## 2022-04-11 ENCOUNTER — Other Ambulatory Visit: Payer: Self-pay | Admitting: Family Medicine

## 2022-04-11 MED ORDER — CIPROFLOXACIN HCL 500 MG PO TABS: 500.0000 mg | ORAL_TABLET | Freq: Two times a day (BID) | ORAL | 0 refills | Status: DC

## 2022-04-21 ENCOUNTER — Ambulatory Visit: Payer: Self-pay | Admitting: *Deleted

## 2022-04-21 NOTE — Telephone Encounter (Signed)
Summary: UTI symptoms lingering   Pt was treated for a UTI and was prescribed medication/ pt finished the medication but is still having lower left side back pain / pt is wanted to know if another 7 day round of Cipro can be called in / this morning he has a bit more pain than yesterday / please advise      Reason for Disposition  Side (flank) or lower back pain present  Answer Assessment - Initial Assessment Questions 1. SYMPTOM: "What's the main symptom you're concerned about?" (e.g., frequency, incontinence)     Discomfort- back/left pain, burning with urination 2. ONSET: "When did the  pain  start?"     Culture- 6/27- finished antibiotic last night 3. PAIN: "Is there any pain?" If Yes, ask: "How bad is it?" (Scale: 1-10; mild, moderate, severe)     Yes- mild 4. CAUSE: "What do you think is causing the symptoms?"     UTI 5. OTHER SYMPTOMS: "Do you have any other symptoms?" (e.g., fever, flank pain, blood in urine, pain with urination)     Flank pain, pain with urination 6. PREGNANCY: "Is there any chance you are pregnant?" "When was your last menstrual period?"  Protocols used: Urinary Symptoms-A-AH

## 2022-04-21 NOTE — Telephone Encounter (Signed)
  Chief Complaint: urinary symptoms after antibiotic Symptoms: back pain, burning with urination Frequency: back pain has been present, burning with urination today Pertinent Negatives: Patient denies fever, blood in urine Disposition: '[]'$ ED /'[]'$ Urgent Care (no appt availability in office) / '[]'$ Appointment(In office/virtual)/ '[]'$  [place] [facility]/ '[]'$ Home Care/ '[]'$ Refused Recommended Disposition /'[]'$  Mobile Bus/ '[x]'$  Follow-up with PCP Additional Notes: Patient had urine culture 6/27 and finish Cipro Rx last night- patient reports he is still having symptoms after treatment. Asking for another treatment- either RF or different antibiotic. Patient advised would send message to provider.

## 2022-04-21 NOTE — Telephone Encounter (Signed)
Called patient to schedule an appointment. He states that he will just hold off and see if it gets better for him. If it doesn't he will call back.

## 2022-04-21 NOTE — Telephone Encounter (Signed)
Do you wish for the patient to be seen or are you going to refill his Cipro? Please advise

## 2022-04-22 ENCOUNTER — Ambulatory Visit (INDEPENDENT_AMBULATORY_CARE_PROVIDER_SITE_OTHER): Payer: Medicare PPO | Admitting: Nurse Practitioner

## 2022-04-22 ENCOUNTER — Encounter: Payer: Self-pay | Admitting: Nurse Practitioner

## 2022-04-22 VITALS — BP 138/88 | HR 66 | Temp 98.0°F | Wt 209.0 lb

## 2022-04-22 DIAGNOSIS — N3000 Acute cystitis without hematuria: Secondary | ICD-10-CM | POA: Diagnosis not present

## 2022-04-22 LAB — MICROSCOPIC EXAMINATION
Bacteria, UA: NONE SEEN
RBC, Urine: NONE SEEN /hpf (ref 0–2)

## 2022-04-22 LAB — URINALYSIS, ROUTINE W REFLEX MICROSCOPIC
Bilirubin, UA: NEGATIVE
Glucose, UA: NEGATIVE
Ketones, UA: NEGATIVE
Nitrite, UA: NEGATIVE
RBC, UA: NEGATIVE
Specific Gravity, UA: >1.03 — ABNORMAL HIGH (ref 1.005–1.030)
Urobilinogen, Ur: 0.2 mg/dL (ref 0.2–1.0)
pH, UA: 5.5 (ref 5.0–7.5)

## 2022-04-22 MED ORDER — NITROFURANTOIN MONOHYD MACRO 100 MG PO CAPS
100.0000 mg | ORAL_CAPSULE | Freq: Two times a day (BID) | ORAL | 0 refills | Status: DC
Start: 2022-04-22 — End: 2022-10-12

## 2022-04-22 NOTE — Progress Notes (Signed)
BP 138/88   Pulse 66   Temp 98 F (36.7 C) (Oral)   Wt 209 lb (94.8 kg)   SpO2 96%   BMI 33.73 kg/m    Subjective:    Patient ID: Ralph Chapman, male    DOB: August 27, 1946, 76 y.o.   MRN: [id number]  HPI: Ralph Chapman is a 76 y.o. male  Chief Complaint  Patient presents with   urinary symptoms    Patient reports he had a UTI last office visit with Dr. Wynetta Emery, states he was rx cipro with no relief. States after he took cipro he started having kidney/back pain. States he is upset he was rx cipro because his daughter is a Publishing rights manager and does not know why he was rx'd almost a placebo effect type abx.    URINARY SYMPTOMS Patient was already treated with Ciprofloxacin BID x 7 days.  Patient states before he started the prescription he started having kidney pain.  The Cipro did not improve symptoms.  Did have burning with urination yesterday.     Relevant past medical, surgical, family and social history reviewed and updated as indicated. Interim medical history since our last visit reviewed. Allergies and medications reviewed and updated.  Review of Systems  Genitourinary:  Positive for dysuria.  Musculoskeletal:  Positive for back pain.    Per HPI unless specifically indicated above     Objective:    BP 138/88   Pulse 66   Temp 98 F (36.7 C) (Oral)   Wt 209 lb (94.8 kg)   SpO2 96%   BMI 33.73 kg/m   Wt Readings from Last 3 Encounters:  04/22/22 209 lb (94.8 kg)  04/05/22 207 lb 12.8 oz (94.3 kg)  10/05/21 208 lb (94.3 kg)    Physical Exam Vitals and nursing note reviewed.  Constitutional:      General: He is not in acute distress.    Appearance: Normal appearance. He is not ill-appearing, toxic-appearing or diaphoretic.  HENT:     Head: Normocephalic.     Right Ear: External ear normal.     Left Ear: External ear normal.     Nose: Nose normal. No congestion or rhinorrhea.     Mouth/Throat:     Mouth: Mucous membranes are moist.  Eyes:     General:         Right eye: No discharge.        Left eye: No discharge.     Extraocular Movements: Extraocular movements intact.     Conjunctiva/sclera: Conjunctivae normal.     Pupils: Pupils are equal, round, and reactive to light.  Cardiovascular:     Rate and Rhythm: Normal rate and regular rhythm.     Heart sounds: No murmur heard. Pulmonary:     Effort: Pulmonary effort is normal. No respiratory distress.     Breath sounds: Normal breath sounds. No wheezing, rhonchi or rales.  Abdominal:     General: Abdomen is flat. Bowel sounds are normal. There is no distension.     Tenderness: There is no abdominal tenderness. There is left CVA tenderness. There is no right CVA tenderness or guarding.  Musculoskeletal:     Cervical back: Normal range of motion and neck supple.  Skin:    General: Skin is warm and dry.     Capillary Refill: Capillary refill takes less than 2 seconds.  Neurological:     General: No focal deficit present.     Mental Status: He is alert and  oriented to person, place, and time.  Psychiatric:        Mood and Affect: Mood normal.        Behavior: Behavior normal.        Thought Content: Thought content normal.        Judgment: Judgment normal.     Results for orders placed or performed in visit on 04/22/22  Microscopic Examination   Urine  Result Value Ref Range   WBC, UA 6-10 (A) 0 - 5 /hpf   RBC, Urine None seen 0 - 2 /hpf   Epithelial Cells (non renal) 0-10 0 - 10 /hpf   Mucus, UA Present (A) Not Estab.   Bacteria, UA None seen None seen/Few  Urinalysis, Routine w reflex microscopic  Result Value Ref Range   Specific Gravity, UA >1.030 (H) 1.005 - 1.030   pH, UA 5.5 5.0 - 7.5   Color, UA Yellow Yellow   Appearance Ur Clear Clear   Leukocytes,UA Trace (A) Negative   Protein,UA Trace (A) Negative/Trace   Glucose, UA Negative Negative   Ketones, UA Negative Negative   RBC, UA Negative Negative   Bilirubin, UA Negative Negative   Urobilinogen, Ur 0.2 0.2 -  1.0 mg/dL   Nitrite, UA Negative Negative   Microscopic Examination See below:       Assessment & Plan:   Problem List Items Addressed This Visit   None Visit Diagnoses     Acute cystitis without hematuria    -  Primary   Repeat UA in office showed trace leuks. Given patient's back pain on exam will treat with macrobid '100mg'$  BID. UC sent. Will make recommendations based on labs.   Relevant Orders   Urinalysis, Routine w reflex microscopic (Completed)   Urine Culture        Follow up plan: Return if symptoms worsen or fail to improve.

## 2022-04-24 LAB — URINE CULTURE: Organism ID, Bacteria: NO GROWTH

## 2022-04-25 ENCOUNTER — Telehealth: Payer: Self-pay | Admitting: *Deleted

## 2022-04-25 NOTE — Progress Notes (Signed)
Please let patient know that his urine culture did not grow any bacteria.  This means the cipro took care of the bacteria.  He can stop the macrobid since there was no growth on his urine culture.

## 2022-04-25 NOTE — Telephone Encounter (Signed)
Patient returned call and was notified: Please let patient know that his urine culture did not grow any bacteria.  This means the cipro took care of the bacteria.  He can stop the macrobid since there was no growth on his urine culture.

## 2022-10-12 ENCOUNTER — Ambulatory Visit: Payer: Medicare PPO | Admitting: Family Medicine

## 2022-10-12 ENCOUNTER — Encounter: Payer: Self-pay | Admitting: Family Medicine

## 2022-10-12 VITALS — BP 98/64 | HR 74 | Temp 98.3°F | Ht 66.0 in | Wt 210.2 lb

## 2022-10-12 DIAGNOSIS — E118 Type 2 diabetes mellitus with unspecified complications: Secondary | ICD-10-CM

## 2022-10-12 DIAGNOSIS — R972 Elevated prostate specific antigen [PSA]: Secondary | ICD-10-CM | POA: Diagnosis not present

## 2022-10-12 DIAGNOSIS — E7800 Pure hypercholesterolemia, unspecified: Secondary | ICD-10-CM | POA: Diagnosis not present

## 2022-10-12 DIAGNOSIS — N23 Unspecified renal colic: Secondary | ICD-10-CM | POA: Diagnosis not present

## 2022-10-12 DIAGNOSIS — M545 Low back pain, unspecified: Secondary | ICD-10-CM

## 2022-10-12 DIAGNOSIS — Z23 Encounter for immunization: Secondary | ICD-10-CM

## 2022-10-12 DIAGNOSIS — R251 Tremor, unspecified: Secondary | ICD-10-CM | POA: Diagnosis not present

## 2022-10-12 DIAGNOSIS — I10 Essential (primary) hypertension: Secondary | ICD-10-CM | POA: Diagnosis not present

## 2022-10-12 DIAGNOSIS — N138 Other obstructive and reflux uropathy: Secondary | ICD-10-CM | POA: Diagnosis not present

## 2022-10-12 DIAGNOSIS — N401 Enlarged prostate with lower urinary tract symptoms: Secondary | ICD-10-CM

## 2022-10-12 LAB — URINALYSIS, ROUTINE W REFLEX MICROSCOPIC
Bilirubin, UA: NEGATIVE
Glucose, UA: NEGATIVE
Leukocytes,UA: NEGATIVE
Nitrite, UA: NEGATIVE
RBC, UA: NEGATIVE
Specific Gravity, UA: >1.03 — ABNORMAL HIGH (ref 1.005–1.030)
Urobilinogen, Ur: 0.2 mg/dL (ref 0.2–1.0)
pH, UA: 5 (ref 5.0–7.5)

## 2022-10-12 LAB — MICROSCOPIC EXAMINATION: Bacteria, UA: NONE SEEN

## 2022-10-12 LAB — BAYER DCA HB A1C WAIVED: HB A1C (BAYER DCA - WAIVED): 8.4 % — ABNORMAL HIGH (ref 4.8–5.6)

## 2022-10-12 MED ORDER — BENAZEPRIL HCL 40 MG PO TABS: 40.0000 mg | ORAL_TABLET | Freq: Every day | ORAL | 1 refills | Status: DC

## 2022-10-12 MED ORDER — SIMVASTATIN 20 MG PO TABS: 20.0000 mg | ORAL_TABLET | Freq: Every day | ORAL | 1 refills | Status: DC

## 2022-10-12 MED ORDER — METFORMIN HCL ER 500 MG PO TB24: 1000.0000 mg | ORAL_TABLET | Freq: Two times a day (BID) | ORAL | 1 refills | Status: DC | PRN

## 2022-10-12 NOTE — Progress Notes (Signed)
BP 98/64   Pulse 74   Temp 98.3 F (36.8 C) (Oral)   Ht '5\' 6"'$  (1.676 m)   Wt 210 lb 3.2 oz (95.3 kg)   SpO2 95%   BMI 33.93 kg/m    Subjective:    Patient ID: Ralph Chapman, male    DOB: 1946-07-06, 77 y.o.   MRN: [id number]  HPI: Ralph Chapman is a 77 y.o. male  Chief Complaint  Patient presents with   Diabetes   Hypertension   Tremors   Flank Pain   DIABETES Hypoglycemic episodes:no Polydipsia/polyuria: no Visual disturbance: no Chest pain: no Paresthesias: no Glucose Monitoring: yes  Accucheck frequency: Daily  Fasting glucose: 115-125 Taking Insulin?: no Blood Pressure Monitoring: rarely Retinal Examination: Up to Date Foot Exam: Up to Date Diabetic Education: Completed Pneumovax: Up to Date Influenza: Up to Date Aspirin: no  HYPERTENSION / HYPERLIPIDEMIA Satisfied with current treatment? yes Duration of hypertension: chronic BP monitoring frequency: not checking BP medication side effects: no Past BP meds: benazepril Duration of hyperlipidemia: chronic Cholesterol medication side effects: no Cholesterol supplements: none Past cholesterol medications: simvastatin Medication compliance: excellent compliance Aspirin: no Recent stressors: no Recurrent headaches: no Visual changes: no Palpitations: no Dyspnea: no Chest pain: no Lower extremity edema: no Dizzy/lightheaded: no  Relevant past medical, surgical, family and social history reviewed and updated as indicated. Interim medical history since our last visit reviewed. Allergies and medications reviewed and updated.  Review of Systems  Constitutional: Negative.   HENT: Negative.    Respiratory:  Positive for cough. Negative for apnea, choking, chest tightness, shortness of breath, wheezing and stridor.   Cardiovascular: Negative.   Gastrointestinal: Negative.   Musculoskeletal:  Positive for myalgias. Negative for arthralgias, back pain, gait problem, joint swelling, neck pain and neck  stiffness.  Skin: Negative.   Neurological:  Positive for tremors.  Psychiatric/Behavioral: Negative.      Per HPI unless specifically indicated above     Objective:    BP 98/64   Pulse 74   Temp 98.3 F (36.8 C) (Oral)   Ht '5\' 6"'$  (1.676 m)   Wt 210 lb 3.2 oz (95.3 kg)   SpO2 95%   BMI 33.93 kg/m   Wt Readings from Last 3 Encounters:  10/12/22 210 lb 3.2 oz (95.3 kg)  04/22/22 209 lb (94.8 kg)  04/05/22 207 lb 12.8 oz (94.3 kg)    Physical Exam Vitals and nursing note reviewed.  Constitutional:      General: He is not in acute distress.    Appearance: Normal appearance. He is obese. He is not ill-appearing, toxic-appearing or diaphoretic.  HENT:     Head: Normocephalic and atraumatic.     Right Ear: External ear normal.     Left Ear: External ear normal.     Nose: Nose normal.     Mouth/Throat:     Mouth: Mucous membranes are moist.     Pharynx: Oropharynx is clear.  Eyes:     General: No scleral icterus.       Right eye: No discharge.        Left eye: No discharge.     Extraocular Movements: Extraocular movements intact.     Conjunctiva/sclera: Conjunctivae normal.     Pupils: Pupils are equal, round, and reactive to light.  Cardiovascular:     Rate and Rhythm: Normal rate and regular rhythm.     Pulses: Normal pulses.     Heart sounds: Normal heart sounds. No murmur  heard.    No friction rub. No gallop.  Pulmonary:     Effort: Pulmonary effort is normal. No respiratory distress.     Breath sounds: Normal breath sounds. No stridor. No wheezing, rhonchi or rales.  Chest:     Chest wall: No tenderness.  Musculoskeletal:        General: Normal range of motion.     Cervical back: Normal range of motion and neck supple.  Skin:    General: Skin is warm and dry.     Capillary Refill: Capillary refill takes less than 2 seconds.     Coloration: Skin is not jaundiced or pale.     Findings: No bruising, erythema, lesion or rash.  Neurological:     General: No  focal deficit present.     Mental Status: He is alert and oriented to person, place, and time. Mental status is at baseline.  Psychiatric:        Mood and Affect: Mood normal.        Behavior: Behavior normal.        Thought Content: Thought content normal.        Judgment: Judgment normal.     Results for orders placed or performed in visit on 10/12/22  Microscopic Examination   BLD  Result Value Ref Range   WBC, UA 0-5 0 - 5 /hpf   RBC, Urine 0-2 0 - 2 /hpf   Epithelial Cells (non renal) 0-10 0 - 10 /hpf   Bacteria, UA None seen None seen/Few  Bayer DCA Hb A1c Waived  Result Value Ref Range   HB A1C (BAYER DCA - WAIVED) 8.4 (H) 4.8 - 5.6 %  CBC with Differential/Platelet  Result Value Ref Range   WBC 8.1 3.4 - 10.8 x10E3/uL   RBC 4.47 4.14 - 5.80 x10E6/uL   Hemoglobin 14.2 13.0 - 17.7 g/dL   Hematocrit 42.5 37.5 - 51.0 %   MCV 95 79 - 97 fL   MCH 31.8 26.6 - 33.0 pg   MCHC 33.4 31.5 - 35.7 g/dL   RDW 12.6 11.6 - 15.4 %   Platelets 264 150 - 450 x10E3/uL   Neutrophils 65 Not Estab. %   Lymphs 17 Not Estab. %   Monocytes 10 Not Estab. %   Eos 6 Not Estab. %   Basos 2 Not Estab. %   Neutrophils Absolute 5.3 1.4 - 7.0 x10E3/uL   Lymphocytes Absolute 1.3 0.7 - 3.1 x10E3/uL   Monocytes Absolute 0.8 0.1 - 0.9 x10E3/uL   EOS (ABSOLUTE) 0.5 (H) 0.0 - 0.4 x10E3/uL   Basophils Absolute 0.1 0.0 - 0.2 x10E3/uL   Immature Granulocytes 0 Not Estab. %   Immature Grans (Abs) 0.0 0.0 - 0.1 x10E3/uL  Comprehensive metabolic panel  Result Value Ref Range   Glucose 133 (H) 70 - 99 mg/dL   BUN 21 8 - 27 mg/dL   Creatinine, Ser 1.36 (H) 0.76 - 1.27 mg/dL   eGFR 55 (L) >59 mL/min/1.73   BUN/Creatinine Ratio 15 10 - 24   Sodium 140 134 - 144 mmol/L   Potassium 4.7 3.5 - 5.2 mmol/L   Chloride 105 96 - 106 mmol/L   CO2 20 20 - 29 mmol/L   Calcium 9.6 8.6 - 10.2 mg/dL   Total Protein 7.1 6.0 - 8.5 g/dL   Albumin 4.7 3.8 - 4.8 g/dL   Globulin, Total 2.4 1.5 - 4.5 g/dL    Albumin/Globulin Ratio 2.0 1.2 - 2.2   Bilirubin Total 0.7 0.0 -  1.2 mg/dL   Alkaline Phosphatase 77 44 - 121 IU/L   AST 19 0 - 40 IU/L   ALT 16 0 - 44 IU/L  Lipid Panel w/o Chol/HDL Ratio  Result Value Ref Range   Cholesterol, Total 162 100 - 199 mg/dL   Triglycerides 246 (H) 0 - 149 mg/dL   HDL 42 >39 mg/dL   VLDL Cholesterol Cal 41 (H) 5 - 40 mg/dL   LDL Chol Calc (NIH) 79 0 - 99 mg/dL  PSA  Result Value Ref Range   Prostate Specific Ag, Serum 5.0 (H) 0.0 - 4.0 ng/mL  Urinalysis, Routine w reflex microscopic  Result Value Ref Range   Specific Gravity, UA >1.030 (H) 1.005 - 1.030   pH, UA 5.0 5.0 - 7.5   Color, UA Yellow Yellow   Appearance Ur Clear Clear   Leukocytes,UA Negative Negative   Protein,UA 1+ (A) Negative/Trace   Glucose, UA Negative Negative   Ketones, UA Trace (A) Negative   RBC, UA Negative Negative   Bilirubin, UA Negative Negative   Urobilinogen, Ur 0.2 0.2 - 1.0 mg/dL   Nitrite, UA Negative Negative   Microscopic Examination See below:       Assessment & Plan:   Problem List Items Addressed This Visit       Cardiovascular and Mediastinum   Hypertension    Under good control on current regimen. Continue current regimen. Continue to monitor. Call with any concerns. Refills given. Labs drawn today.        Relevant Medications   benazepril (LOTENSIN) 40 MG tablet   simvastatin (ZOCOR) 20 MG tablet   Other Relevant Orders   CBC with Differential/Platelet (Completed)   Comprehensive metabolic panel (Completed)     Endocrine   Controlled diabetes mellitus type 2 with complications ([place]) - Primary    Up to A1c of 8.4- will work on diet and exercise and recheck in 3 months. Call with any concerns.       Relevant Medications   benazepril (LOTENSIN) 40 MG tablet   metFORMIN (GLUCOPHAGE-XR) 500 MG 24 hr tablet   simvastatin (ZOCOR) 20 MG tablet   Other Relevant Orders   Bayer DCA Hb A1c Waived (Completed)   CBC with Differential/Platelet  (Completed)   Comprehensive metabolic panel (Completed)   Lipid Panel w/o Chol/HDL Ratio (Completed)     Genitourinary   Benign localized hyperplasia of prostate with urinary obstruction    Rechecking labs today. Await results. Treat as needed.         Other   Hyperlipidemia    Under good control on current regimen. Continue current regimen. Continue to monitor. Call with any concerns. Refills given. Labs drawn today.       Relevant Medications   benazepril (LOTENSIN) 40 MG tablet   simvastatin (ZOCOR) 20 MG tablet   Elevated prostate specific antigen (PSA)    Rechecking labs today. Await results. Treat as needed.       Relevant Orders   CBC with Differential/Platelet (Completed)   Comprehensive metabolic panel (Completed)   PSA (Completed)   Other Visit Diagnoses     Low back pain, unspecified back pain laterality, unspecified chronicity, unspecified whether sciatica present       Will start stretches. Call if not getting better or getting worse.   Relevant Orders   Urinalysis, Routine w reflex microscopic   Tremor       Offered referral to neurology. Would like to hold at this time. Call with any concerns.  Essential hypertension       Relevant Medications   benazepril (LOTENSIN) 40 MG tablet   simvastatin (ZOCOR) 20 MG tablet   Flu vaccine need       Flu shot given today.   Relevant Orders   Flu Vaccine QUAD High Dose(Fluad) (Completed)        Follow up plan: Return in about 3 months (around 01/11/2023).

## 2022-10-13 LAB — CBC WITH DIFFERENTIAL/PLATELET
Basophils Absolute: 0.1 10*3/uL (ref 0.0–0.2)
Basos: 2 %
EOS (ABSOLUTE): 0.5 10*3/uL — ABNORMAL HIGH (ref 0.0–0.4)
Eos: 6 %
Hematocrit: 42.5 % (ref 37.5–51.0)
Hemoglobin: 14.2 g/dL (ref 13.0–17.7)
Immature Grans (Abs): 0 10*3/uL (ref 0.0–0.1)
Immature Granulocytes: 0 %
Lymphocytes Absolute: 1.3 10*3/uL (ref 0.7–3.1)
Lymphs: 17 %
MCH: 31.8 pg (ref 26.6–33.0)
MCHC: 33.4 g/dL (ref 31.5–35.7)
MCV: 95 fL (ref 79–97)
Monocytes Absolute: 0.8 10*3/uL (ref 0.1–0.9)
Monocytes: 10 %
Neutrophils Absolute: 5.3 10*3/uL (ref 1.4–7.0)
Neutrophils: 65 %
Platelets: 264 10*3/uL (ref 150–450)
RBC: 4.47 x10E6/uL (ref 4.14–5.80)
RDW: 12.6 % (ref 11.6–15.4)
WBC: 8.1 10*3/uL (ref 3.4–10.8)

## 2022-10-13 LAB — COMPREHENSIVE METABOLIC PANEL
ALT: 16 IU/L (ref 0–44)
AST: 19 IU/L (ref 0–40)
Albumin/Globulin Ratio: 2 (ref 1.2–2.2)
Albumin: 4.7 g/dL (ref 3.8–4.8)
Alkaline Phosphatase: 77 IU/L (ref 44–121)
BUN/Creatinine Ratio: 15 (ref 10–24)
BUN: 21 mg/dL (ref 8–27)
Bilirubin Total: 0.7 mg/dL (ref 0.0–1.2)
CO2: 20 mmol/L (ref 20–29)
Calcium: 9.6 mg/dL (ref 8.6–10.2)
Chloride: 105 mmol/L (ref 96–106)
Creatinine, Ser: 1.36 mg/dL — ABNORMAL HIGH (ref 0.76–1.27)
Globulin, Total: 2.4 g/dL (ref 1.5–4.5)
Glucose: 133 mg/dL — ABNORMAL HIGH (ref 70–99)
Potassium: 4.7 mmol/L (ref 3.5–5.2)
Sodium: 140 mmol/L (ref 134–144)
Total Protein: 7.1 g/dL (ref 6.0–8.5)
eGFR: 55 mL/min/{1.73_m2} — ABNORMAL LOW (ref >59)

## 2022-10-13 LAB — PSA: Prostate Specific Ag, Serum: 5 ng/mL — ABNORMAL HIGH (ref 0.0–4.0)

## 2022-10-13 LAB — LIPID PANEL W/O CHOL/HDL RATIO
Cholesterol, Total: 162 mg/dL (ref 100–199)
HDL: 42 mg/dL (ref >39)
LDL Chol Calc (NIH): 79 mg/dL (ref 0–99)
Triglycerides: 246 mg/dL — ABNORMAL HIGH (ref 0–149)
VLDL Cholesterol Cal: 41 mg/dL — ABNORMAL HIGH (ref 5–40)

## 2022-10-16 NOTE — Assessment & Plan Note (Signed)
Rechecking labs today. Await results. Treat as needed.  °

## 2022-10-16 NOTE — Assessment & Plan Note (Signed)
Under good control on current regimen. Continue current regimen. Continue to monitor. Call with any concerns. Refills given. Labs drawn today.   

## 2022-10-16 NOTE — Assessment & Plan Note (Signed)
Up to A1c of 8.4- will work on diet and exercise and recheck in 3 months. Call with any concerns.

## 2022-11-09 LAB — HM DIABETES EYE EXAM

## 2022-11-10 DIAGNOSIS — E119 Type 2 diabetes mellitus without complications: Secondary | ICD-10-CM | POA: Diagnosis not present

## 2022-11-10 DIAGNOSIS — I2510 Atherosclerotic heart disease of native coronary artery without angina pectoris: Secondary | ICD-10-CM | POA: Diagnosis not present

## 2022-11-10 DIAGNOSIS — D124 Benign neoplasm of descending colon: Secondary | ICD-10-CM | POA: Diagnosis not present

## 2022-11-10 DIAGNOSIS — I10 Essential (primary) hypertension: Secondary | ICD-10-CM | POA: Diagnosis not present

## 2022-11-10 DIAGNOSIS — D120 Benign neoplasm of cecum: Secondary | ICD-10-CM | POA: Diagnosis not present

## 2022-11-10 DIAGNOSIS — E785 Hyperlipidemia, unspecified: Secondary | ICD-10-CM | POA: Diagnosis not present

## 2022-11-10 DIAGNOSIS — Z85038 Personal history of other malignant neoplasm of large intestine: Secondary | ICD-10-CM | POA: Diagnosis not present

## 2022-11-10 DIAGNOSIS — Z86010 Personal history of colonic polyps: Secondary | ICD-10-CM | POA: Diagnosis not present

## 2022-11-10 DIAGNOSIS — K635 Polyp of colon: Secondary | ICD-10-CM | POA: Diagnosis not present

## 2022-11-10 DIAGNOSIS — Z1211 Encounter for screening for malignant neoplasm of colon: Secondary | ICD-10-CM | POA: Diagnosis not present

## 2022-11-10 DIAGNOSIS — K648 Other hemorrhoids: Secondary | ICD-10-CM | POA: Diagnosis not present

## 2022-11-10 DIAGNOSIS — D123 Benign neoplasm of transverse colon: Secondary | ICD-10-CM | POA: Diagnosis not present

## 2022-11-10 LAB — HM COLONOSCOPY

## 2022-11-29 ENCOUNTER — Ambulatory Visit (INDEPENDENT_AMBULATORY_CARE_PROVIDER_SITE_OTHER): Payer: Medicare PPO

## 2022-11-29 VITALS — Ht 66.0 in | Wt 210.0 lb

## 2022-11-29 DIAGNOSIS — Z0000 Encounter for general adult medical examination without abnormal findings: Secondary | ICD-10-CM

## 2022-11-29 NOTE — Progress Notes (Signed)
I connected with  Ivor Costa on 11/29/22 by a audio enabled telemedicine application and verified that I am speaking with the correct person using two identifiers.  Patient Location: Home  Provider Location: Home Office  I discussed the limitations of evaluation and management by telemedicine. The patient expressed understanding and agreed to proceed.  Subjective:   Ralph Chapman is a 77 y.o. male who presents for Medicare Annual/Subsequent preventive examination.  Review of Systems     Cardiac Risk Factors include: advanced age (>25mn, >>12women);diabetes mellitus;male gender;hypertension     Objective:    There were no vitals filed for this visit. There is no height or weight on file to calculate BMI.     11/29/2022   10:18 AM 11/16/2021    9:05 AM 09/28/2020    2:32 PM 09/25/2019    8:57 AM 09/13/2018    3:24 PM 09/01/2017    8:09 AM 05/23/2017    8:51 AM  Advanced Directives  Does Patient Have a Medical Advance Directive? No No No No No No Yes;No  Would patient like information on creating a medical advance directive? No - Patient declined No - Patient declined   Yes (MAU/Ambulatory/Procedural Areas - Information given) Yes (MAU/Ambulatory/Procedural Areas - Information given)     Current Medications (verified) Outpatient Encounter Medications as of 11/29/2022  Medication Sig   benazepril (LOTENSIN) 40 MG tablet Take 1 tablet (40 mg total) by mouth daily.   Cholecalciferol 125 MCG (5000 UT) TABS Take by mouth.   glucosamine-chondroitin 500-400 MG tablet Take by mouth.   glucose blood (ACCU-CHEK GUIDE) test strip USE TO CHECK BLOOD GLUCOSE TWICE DAILY   metFORMIN (GLUCOPHAGE-XR) 500 MG 24 hr tablet Take 2 tablets (1,000 mg total) by mouth 2 (two) times daily as needed.   simvastatin (ZOCOR) 20 MG tablet Take 1 tablet (20 mg total) by mouth at bedtime.   No facility-administered encounter medications on file as of 11/29/2022.    Allergies (verified) Aspirin,  Niacin and related, and Tramadol   History: Past Medical History:  Diagnosis Date   Diabetes mellitus without complication ([place]    Hyperlipidemia    Hypertension    Past Surgical History:  Procedure Laterality Date   APPENDECTOMY     COLONOSCOPY WITH PROPOFOL N/A 09/05/2016   Procedure: COLONOSCOPY WITH PROPOFOL;  Surgeon: DLucilla Lame MD;  [facility]  Service: Endoscopy;  Laterality: N/A;  Diabetic - oral meds   POLYPECTOMY  09/05/2016   Procedure: POLYPECTOMY;  Surgeon: DLucilla Lame MD;  [facility]  Service: Endoscopy;;   TONSILLECTOMY     Family History  Problem Relation Age of Onset   Cancer Mother    Heart attack Father 526  Heart disease Brother    Social History   Socioeconomic History   Marital status: Married    Spouse name: Not on file   Number of children: Not on file   Years of education: Not on file   Highest education level: Not on file  Occupational History   Not on file  Tobacco Use   Smoking status: Never   Smokeless tobacco: Never  Vaping Use   Vaping Use: Never used  Substance and Sexual Activity   Alcohol use: No   Drug use: No   Sexual activity: Not Currently  Other Topics Concern   Not on file  Social History Narrative   Not on file   Social Determinants of Health   Financial Resource Strain: Low  Risk  (11/29/2022)   Overall Financial Resource Strain (CARDIA)    Difficulty of Paying Living Expenses: Not hard at all  Food Insecurity: No Food Insecurity (11/29/2022)   Hunger Vital Sign    Worried About Running Out of Food in the Last Year: Never true    Ran Out of Food in the Last Year: Never true  Transportation Needs: No Transportation Needs (11/29/2022)   PRAPARE - Hydrologist (Medical): No    Lack of Transportation (Non-Medical): No  Physical Activity: Sufficiently Active (11/29/2022)   Exercise Vital Sign    Days of Exercise per Week: 5 days    Minutes of Exercise  per Session: 30 min  Stress: No Stress Concern Present (11/29/2022)   [place]    Feeling of Stress : Not at all  Social Connections: [place] (11/29/2022)   Social Connection and Isolation Panel [NHANES]    Frequency of Communication with Friends and Family: More than three times a week    Frequency of Social Gatherings with Friends and Family: More than three times a week    Attends Religious Services: More than 4 times per year    Active Member of [employer] or Organizations: Yes    Attends Music therapist: More than 4 times per year    Marital Status: Married    Tobacco Counseling Counseling given: Not Answered   Clinical Intake:  Pre-visit preparation completed: Yes  Pain : No/denies pain     Nutritional Risks: None Diabetes: Yes CBG done?: No Did pt. bring in CBG monitor from home?: No  How often do you need to have someone help you when you read instructions, pamphlets, or other written materials from your doctor or pharmacy?: 1 - Never  Diabetic?yes Nutrition Risk Assessment:  Has the patient had any N/V/D within the last 2 months?  Yes  Does the patient have any non-healing wounds?  No  Has the patient had any unintentional weight loss or weight gain?  No   Diabetes:  Is the patient diabetic?  Yes  If diabetic, was a CBG obtained today?  No  Did the patient bring in their glucometer from home?  No  How often do you monitor your CBG's? Twice/day.   Financial Strains and Diabetes Management:  Are you having any financial strains with the device, your supplies or your medication? No .  Does the patient want to be seen by Chronic Care Management for management of their diabetes?  No  Would the patient like to be referred to a Nutritionist or for Diabetic Management?  No   Diabetic Exams:  Diabetic Eye Exam: Completed 11/09/22. Pt has been advised about the importance in  completing this exam.  Diabetic Foot Exam: Completed 10/12/22. Pt has been advised about the importance in completing this exam.   Interpreter Needed?: No  Information entered by :: Kirke Shaggy, LPN   Activities of Daily Living    11/29/2022   10:20 AM 11/25/2022    8:49 AM  In your present state of health, do you have any difficulty performing the following activities:  Hearing? 0 0  Vision? 0 0  Difficulty concentrating or making decisions? 0 0  Walking or climbing stairs? 0 0  Dressing or bathing? 0 0  Doing errands, shopping? 0 0  Preparing Food and eating ? N N  Using the Toilet? N N  In the past six months, have you  accidently leaked urine? N N  Do you have problems with loss of bowel control? N N  Managing your Medications? N N  Managing your Finances? N N  Housekeeping or managing your Housekeeping? N N    Patient Care Team: Valerie Roys, DO as PCP - General (Family Medicine) Lucilla Lame, MD as Consulting Physician (Gastroenterology)  Indicate any recent Medical Services you may have received from other than [facility] providers in the past year (date may be approximate).     Assessment:   This is a routine wellness examination for Mujahid.  Hearing/Vision screen Hearing Screening - Comments:: No aids Vision Screening - Comments:: Wears glasses- [employer] Dr.Shade  Dietary issues and exercise activities discussed: Current Exercise Habits: Home exercise routine, Type of exercise: walking, Time (Minutes): 35, Frequency (Times/Week): 5, Weekly Exercise (Minutes/Week): 175, Intensity: Mild   Goals Addressed             This Visit's Progress    DIET - EAT MORE FRUITS AND VEGETABLES         Depression Screen    11/29/2022   10:16 AM 10/12/2022    8:20 AM 04/22/2022   10:02 AM 04/05/2022    8:12 AM 11/16/2021    9:20 AM 10/05/2021    8:40 AM 04/01/2021    8:08 AM  PHQ 2/9 Scores  PHQ - 2 Score 0 0 0 0 0 0 0  PHQ- 9 Score 0 0 0 0 0 0     Fall Risk     11/29/2022   10:20 AM 11/25/2022    8:49 AM 10/12/2022    8:20 AM 04/22/2022   10:02 AM 04/05/2022    8:12 AM  Fall Risk   Falls in the past year? 0 0 0 0 0  Number falls in past yr: 0  0 0 0  Injury with Fall? 0  0 0 0  Risk for fall due to : No Fall Risks  No Fall Risks No Fall Risks No Fall Risks  Follow up Falls prevention discussed;Falls evaluation completed  Falls evaluation completed Falls evaluation completed Falls evaluation completed    FALL RISK PREVENTION PERTAINING TO THE HOME:  Any stairs in or around the home? Yes  If so, are there any without handrails? No  Home free of loose throw rugs in walkways, pet beds, electrical cords, etc? Yes  Adequate lighting in your home to reduce risk of falls? Yes   ASSISTIVE DEVICES UTILIZED TO PREVENT FALLS:  Life alert? No  Use of a cane, walker or w/c? No  Grab bars in the bathroom? No  Shower chair or bench in shower? No  Elevated toilet seat or a handicapped toilet? No    Cognitive Function:        11/29/2022   10:29 AM 09/28/2020    2:36 PM 09/13/2018    3:28 PM 09/01/2017    8:13 AM  6CIT Screen  What Year? 0 points 0 points 0 points 0 points  What month? 0 points 0 points 0 points 0 points  What time? 0 points 0 points 0 points 0 points  Count back from 20 0 points 0 points 0 points 0 points  Months in reverse 0 points 0 points 0 points 0 points  Repeat phrase 0 points 0 points 2 points 0 points  Total Score 0 points 0 points 2 points 0 points    Immunizations Immunization History  Administered Date(s) Administered   Fluad Quad(high Dose 65+) 09/28/2020, 10/05/2021, 10/12/2022  Influenza, High Dose Seasonal PF 08/01/2016, 09/01/2017, 09/13/2018   Influenza,inj,quad, With Preservative 08/01/2016, 09/01/2017, 09/13/2018, 06/26/2019   Influenza-Unspecified 08/17/2015, 08/01/2016, 09/01/2017, 09/13/2018   Moderna Sars-Covid-2 Vaccination 11/15/2019, 12/17/2019, 08/06/2020   Pneumococcal Conjugate-13 05/15/2014    Pneumococcal Polysaccharide-23 04/13/2013   Pneumococcal-Unspecified 03/21/2007, 04/10/2013   Tdap 05/15/2014   Zoster Recombinat (Shingrix) 11/23/2018, 12/25/2018, 04/01/2019   Zoster, Live 04/15/2013    TDAP status: Up to date  Flu Vaccine status: Up to date  Pneumococcal vaccine status: Up to date  Covid-19 vaccine status: Completed vaccines  Qualifies for Shingles Vaccine? Yes   Zostavax completed Yes   Shingrix Completed?: Yes  Screening Tests Health Maintenance  Topic Date Due   COVID-19 Vaccine (4 - 2023-24 season) 06/10/2022   Diabetic kidney evaluation - Urine ACR  04/06/2023   HEMOGLOBIN A1C  04/12/2023   Diabetic kidney evaluation - eGFR measurement  10/13/2023   FOOT EXAM  10/13/2023   OPHTHALMOLOGY EXAM  11/10/2023   COLONOSCOPY (Pts 45-14yr Insurance coverage will need to be confirmed)  11/11/2023   Medicare Annual Wellness (AWV)  11/30/2023   DTaP/Tdap/Td (2 - Td or Tdap) 05/15/2024   Pneumonia Vaccine 75 Years old  Completed   INFLUENZA VACCINE  Completed   Hepatitis C Screening  Completed   Zoster Vaccines- Shingrix  Completed   HPV VACCINES  Aged Out    Health Maintenance  Health Maintenance Due  Topic Date Due   COVID-19 Vaccine (4 - 2023-24 season) 06/10/2022    Colorectal cancer screening: Type of screening: Colonoscopy. Completed 11/10/22. Repeat every 3 years  Lung Cancer Screening: (Low Dose CT Chest recommended if Age 75-80years, 30 pack-year currently smoking OR have quit w/in 15years.) does not qualify.   Additional Screening:  Hepatitis C Screening: does qualify; Completed 12/15/15  Vision Screening: Recommended annual ophthalmology exams for early detection of glaucoma and other disorders of the eye. Is the patient up to date with their annual eye exam?  Yes  Who is the provider or what is the name of the office in which the patient attends annual eye exams? Dr.Ritter If pt is not established with a provider, would they like to be  referred to a provider to establish care? No .   Dental Screening: Recommended annual dental exams for proper oral hygiene  Community Resource Referral / Chronic Care Management: CRR required this visit?  No   CCM required this visit?  No      Plan:     I have personally reviewed and noted the following in the patient's chart:   Medical and social history Use of alcohol, tobacco or illicit drugs  Current medications and supplements including opioid prescriptions. Patient is not currently taking opioid prescriptions. Functional ability and status Nutritional status Physical activity Advanced directives List of other physicians Hospitalizations, surgeries, and ER visits in previous 12 months Vitals Screenings to include cognitive, depression, and falls Referrals and appointments  In addition, I have reviewed and discussed with patient certain preventive protocols, quality metrics, and best practice recommendations. A written personalized care plan for preventive services as well as general preventive health recommendations were provided to patient.     LDionisio David LPN   [id number]  Nurse Notes: none

## 2022-11-29 NOTE — Patient Instructions (Signed)
Ralph Chapman , Thank you for taking time to come for your Medicare Wellness Visit. I appreciate your ongoing commitment to your health goals. Please review the following plan we discussed and let me know if I can assist you in the future.   These are the goals we discussed:  Goals      DIET - EAT MORE FRUITS AND VEGETABLES     DIET - INCREASE WATER INTAKE     Recommend drinking at least 5-6 glasses of water a day      Patient Stated     09/28/2020, wants to lose 20 pounds     Weight (lb) < 200 lb (90.7 kg)     Keep Bp down        This is a list of the screening recommended for you and due dates:  Health Maintenance  Topic Date Due   COVID-19 Vaccine (4 - 2023-24 season) 06/10/2022   Yearly kidney health urinalysis for diabetes  04/06/2023   Hemoglobin A1C  04/12/2023   Yearly kidney function blood test for diabetes  10/13/2023   Complete foot exam   10/13/2023   Eye exam for diabetics  11/10/2023   Colon Cancer Screening  11/11/2023   Medicare Annual Wellness Visit  11/30/2023   DTaP/Tdap/Td vaccine (2 - Td or Tdap) 05/15/2024   Pneumonia Vaccine  Completed   Flu Shot  Completed   Hepatitis C Screening: USPSTF Recommendation to screen - Ages 97-79 yo.  Completed   Zoster (Shingles) Vaccine  Completed   HPV Vaccine  Aged Out    Advanced directives: no  Conditions/risks identified: none  Next appointment: Follow up in one year for your annual wellness visit. 12/05/23 @ 9:15 am by phone  Preventive Care 65 Years and Older, Male  Preventive care refers to lifestyle choices and visits with your health care provider that can promote health and wellness. What does preventive care include? A yearly physical exam. This is also called an annual well check. Dental exams once or twice a year. Routine eye exams. Ask your health care provider how often you should have your eyes checked. Personal lifestyle choices, including: Daily care of your teeth and gums. Regular physical  activity. Eating a healthy diet. Avoiding tobacco and drug use. Limiting alcohol use. Practicing safe sex. Taking low doses of aspirin every day. Taking vitamin and mineral supplements as recommended by your health care provider. What happens during an annual well check? The services and screenings done by your health care provider during your annual well check will depend on your age, overall health, lifestyle risk factors, and family history of disease. Counseling  Your health care provider may ask you questions about your: Alcohol use. Tobacco use. Drug use. Emotional well-being. Home and relationship well-being. Sexual activity. Eating habits. History of falls. Memory and ability to understand (cognition). Work and work Statistician. Screening  You may have the following tests or measurements: Height, weight, and BMI. Blood pressure. Lipid and cholesterol levels. These may be checked every 5 years, or more frequently if you are over 42 years old. Skin check. Lung cancer screening. You may have this screening every year starting at age 67 if you have a 30-pack-year history of smoking and currently smoke or have quit within the past 15 years. Fecal occult blood test (FOBT) of the stool. You may have this test every year starting at age 74. Flexible sigmoidoscopy or colonoscopy. You may have a sigmoidoscopy every 5 years or a colonoscopy every 10  years starting at age 47. Prostate cancer screening. Recommendations will vary depending on your family history and other risks. Hepatitis C blood test. Hepatitis B blood test. Sexually transmitted disease (STD) testing. Diabetes screening. This is done by checking your blood sugar (glucose) after you have not eaten for a while (fasting). You may have this done every 1-3 years. Abdominal aortic aneurysm (AAA) screening. You may need this if you are a current or former smoker. Osteoporosis. You may be screened starting at age 53 if you are  at high risk. Talk with your health care provider about your test results, treatment options, and if necessary, the need for more tests. Vaccines  Your health care provider may recommend certain vaccines, such as: Influenza vaccine. This is recommended every year. Tetanus, diphtheria, and acellular pertussis (Tdap, Td) vaccine. You may need a Td booster every 10 years. Zoster vaccine. You may need this after age 33. Pneumococcal 13-valent conjugate (PCV13) vaccine. One dose is recommended after age 25. Pneumococcal polysaccharide (PPSV23) vaccine. One dose is recommended after age 6. Talk to your health care provider about which screenings and vaccines you need and how often you need them. This information is not intended to replace advice given to you by your health care provider. Make sure you discuss any questions you have with your health care provider. Document Released: 10/23/2015 Document Revised: 06/15/2016 Document Reviewed: 07/28/2015 Elsevier Interactive Patient Education  2017 [place] Prevention in the Home Falls can cause injuries. They can happen to people of all ages. There are many things you can do to make your home safe and to help prevent falls. What can I do on the outside of my home? Regularly fix the edges of walkways and driveways and fix any cracks. Remove anything that might make you trip as you walk through a door, such as a raised step or threshold. Trim any bushes or trees on the path to your home. Use bright outdoor lighting. Clear any walking paths of anything that might make someone trip, such as rocks or tools. Regularly check to see if handrails are loose or broken. Make sure that both sides of any steps have handrails. Any raised decks and porches should have guardrails on the edges. Have any leaves, snow, or ice cleared regularly. Use sand or salt on walking paths during winter. Clean up any spills in your garage right away. This includes oil  or grease spills. What can I do in the bathroom? Use night lights. Install grab bars by the toilet and in the tub and shower. Do not use towel bars as grab bars. Use non-skid mats or decals in the tub or shower. If you need to sit down in the shower, use a plastic, non-slip stool. Keep the floor dry. Clean up any water that spills on the floor as soon as it happens. Remove soap buildup in the tub or shower regularly. Attach bath mats securely with double-sided non-slip rug tape. Do not have throw rugs and other things on the floor that can make you trip. What can I do in the bedroom? Use night lights. Make sure that you have a light by your bed that is easy to reach. Do not use any sheets or blankets that are too big for your bed. They should not hang down onto the floor. Have a firm chair that has side arms. You can use this for support while you get dressed. Do not have throw rugs and other things on the floor  that can make you trip. What can I do in the kitchen? Clean up any spills right away. Avoid walking on wet floors. Keep items that you use a lot in easy-to-reach places. If you need to reach something above you, use a strong step stool that has a grab bar. Keep electrical cords out of the way. Do not use floor polish or wax that makes floors slippery. If you must use wax, use non-skid floor wax. Do not have throw rugs and other things on the floor that can make you trip. What can I do with my stairs? Do not leave any items on the stairs. Make sure that there are handrails on both sides of the stairs and use them. Fix handrails that are broken or loose. Make sure that handrails are as long as the stairways. Check any carpeting to make sure that it is firmly attached to the stairs. Fix any carpet that is loose or worn. Avoid having throw rugs at the top or bottom of the stairs. If you do have throw rugs, attach them to the floor with carpet tape. Make sure that you have a light  switch at the top of the stairs and the bottom of the stairs. If you do not have them, ask someone to add them for you. What else can I do to help prevent falls? Wear shoes that: Do not have high heels. Have rubber bottoms. Are comfortable and fit you well. Are closed at the toe. Do not wear sandals. If you use a stepladder: Make sure that it is fully opened. Do not climb a closed stepladder. Make sure that both sides of the stepladder are locked into place. Ask someone to hold it for you, if possible. Clearly mark and make sure that you can see: Any grab bars or handrails. First and last steps. Where the edge of each step is. Use tools that help you move around (mobility aids) if they are needed. These include: Canes. Walkers. Scooters. Crutches. Turn on the lights when you go into a dark area. Replace any light bulbs as soon as they burn out. Set up your furniture so you have a clear path. Avoid moving your furniture around. If any of your floors are uneven, fix them. If there are any pets around you, be aware of where they are. Review your medicines with your doctor. Some medicines can make you feel dizzy. This can increase your chance of falling. Ask your doctor what other things that you can do to help prevent falls. This information is not intended to replace advice given to you by your health care provider. Make sure you discuss any questions you have with your health care provider. Document Released: 07/23/2009 Document Revised: 03/03/2016 Document Reviewed: 10/31/2014 Elsevier Interactive Patient Education  2017 [employer].

## 2023-01-12 ENCOUNTER — Ambulatory Visit: Payer: Medicare PPO | Admitting: Family Medicine

## 2023-01-12 ENCOUNTER — Encounter: Payer: Self-pay | Admitting: Family Medicine

## 2023-01-12 VITALS — BP 123/73 | HR 65 | Temp 98.1°F | Wt 210.1 lb

## 2023-01-12 DIAGNOSIS — E118 Type 2 diabetes mellitus with unspecified complications: Secondary | ICD-10-CM | POA: Diagnosis not present

## 2023-01-12 LAB — BAYER DCA HB A1C WAIVED: HB A1C (BAYER DCA - WAIVED): 7.5 % — ABNORMAL HIGH (ref 4.8–5.6)

## 2023-01-12 NOTE — Assessment & Plan Note (Signed)
Doing much better since stopping his nasal spray. A1c down to 7.5. Will continue current regimen and recheck 3 months. Call with any concerns.

## 2023-01-12 NOTE — Progress Notes (Signed)
BP 123/73   Pulse 65   Temp 98.1 F (36.7 C) (Oral)   Wt 210 lb 1.6 oz (95.3 kg)   SpO2 97%   BMI 33.91 kg/m    Subjective:    Patient ID: Ralph Chapman, male    DOB: [birth year], 77 y.o.   MRN: [id number]  HPI: Ralph Chapman is a 77 y.o. male  Chief Complaint  Patient presents with   Diabetes   DIABETES Hypoglycemic episodes:no Polydipsia/polyuria: no Visual disturbance: no Chest pain: no Paresthesias: no Glucose Monitoring: yes  Accucheck frequency: Daily Taking Insulin?: no Blood Pressure Monitoring: not checking Retinal Examination: Up to Date Foot Exam: Up to Date Diabetic Education: Completed Pneumovax: Up to Date Influenza: Up to Date Aspirin: yes  Relevant past medical, surgical, family and social history reviewed and updated as indicated. Interim medical history since our last visit reviewed. Allergies and medications reviewed and updated.  Review of Systems  Constitutional: Negative.   Respiratory: Negative.    Cardiovascular: Negative.   Musculoskeletal: Negative.   Neurological: Negative.   Psychiatric/Behavioral: Negative.      Per HPI unless specifically indicated above     Objective:    BP 123/73   Pulse 65   Temp 98.1 F (36.7 C) (Oral)   Wt 210 lb 1.6 oz (95.3 kg)   SpO2 97%   BMI 33.91 kg/m   Wt Readings from Last 3 Encounters:  01/12/23 210 lb 1.6 oz (95.3 kg)  11/29/22 210 lb (95.3 kg)  10/12/22 210 lb 3.2 oz (95.3 kg)    Physical Exam Vitals and nursing note reviewed.  Constitutional:      General: He is not in acute distress.    Appearance: Normal appearance. He is normal weight. He is not ill-appearing, toxic-appearing or diaphoretic.  HENT:     Head: Normocephalic and atraumatic.     Right Ear: External ear normal.     Left Ear: External ear normal.     Nose: Nose normal.     Mouth/Throat:     Mouth: Mucous membranes are moist.     Pharynx: Oropharynx is clear.  Eyes:     General: No scleral icterus.        Right eye: No discharge.        Left eye: No discharge.     Extraocular Movements: Extraocular movements intact.     Conjunctiva/sclera: Conjunctivae normal.     Pupils: Pupils are equal, round, and reactive to light.  Cardiovascular:     Rate and Rhythm: Normal rate and regular rhythm.     Pulses: Normal pulses.     Heart sounds: Normal heart sounds. No murmur heard.    No friction rub. No gallop.  Pulmonary:     Effort: Pulmonary effort is normal. No respiratory distress.     Breath sounds: Normal breath sounds. No stridor. No wheezing, rhonchi or rales.  Chest:     Chest wall: No tenderness.  Musculoskeletal:        General: Normal range of motion.     Cervical back: Normal range of motion and neck supple.  Skin:    General: Skin is warm and dry.     Capillary Refill: Capillary refill takes less than 2 seconds.     Coloration: Skin is not jaundiced or pale.     Findings: No bruising, erythema, lesion or rash.  Neurological:     General: No focal deficit present.     Mental Status: He is alert  and oriented to person, place, and time. Mental status is at baseline.  Psychiatric:        Mood and Affect: Mood normal.        Behavior: Behavior normal.        Thought Content: Thought content normal.        Judgment: Judgment normal.     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 A1c down to 7.5. Will continue current regimen and recheck 3 months. Call with any concerns.       Relevant Orders   Bayer DCA Hb A1c Waived     Follow up plan: Return in about 3 months (around 04/13/2023).

## 2023-02-08 DEATH — deceased

## 2023-04-03 ENCOUNTER — Other Ambulatory Visit: Payer: Self-pay | Admitting: Family Medicine

## 2023-04-03 DIAGNOSIS — E118 Type 2 diabetes mellitus with unspecified complications: Secondary | ICD-10-CM

## 2023-04-04 NOTE — Telephone Encounter (Signed)
Requested Prescriptions  Pending Prescriptions Disp Refills   ACCU-CHEK GUIDE test strip [Pharmacy Med Name: ACCU-CHEK GUIDE TEST STRIP] 200 strip 0    Sig: USE TO CHECK BLOOD GLUCOSE TWICE DAILY     Endocrinology: Diabetes - Testing Supplies Passed - 04/03/2023  9:02 AM      Passed - Valid encounter within last 12 months    Recent Outpatient Visits           2 months ago Controlled type 2 diabetes mellitus with complication, without long-term current use of insulin (HCC)   [place] [facility] [place], Megan P, DO   5 months ago Controlled type 2 diabetes mellitus with complication, without long-term current use of insulin (HCC)   [place] [facility] [place], Megan P, DO   11 months ago Acute cystitis without hematuria   [place] [facility] Larae Grooms, NP   12 months ago Routine general medical examination at a health care facility   [facility] [place], [place] P, DO   1 year ago Diabetes mellitus without complication [facility])   [place] [facility] Dorcas Carrow, DO       Future Appointments             In 1 week Laural Benes, Oralia Rud, DO  [facility]

## 2023-04-11 ENCOUNTER — Ambulatory Visit (INDEPENDENT_AMBULATORY_CARE_PROVIDER_SITE_OTHER): Payer: Medicare PPO | Admitting: Family Medicine

## 2023-04-11 ENCOUNTER — Encounter: Payer: Self-pay | Admitting: Family Medicine

## 2023-04-11 VITALS — BP 129/81 | HR 70 | Temp 97.9°F | Ht 65.35 in | Wt 210.0 lb

## 2023-04-11 DIAGNOSIS — N138 Other obstructive and reflux uropathy: Secondary | ICD-10-CM | POA: Diagnosis not present

## 2023-04-11 DIAGNOSIS — R972 Elevated prostate specific antigen [PSA]: Secondary | ICD-10-CM

## 2023-04-11 DIAGNOSIS — R8281 Pyuria: Secondary | ICD-10-CM | POA: Diagnosis not present

## 2023-04-11 DIAGNOSIS — E7800 Pure hypercholesterolemia, unspecified: Secondary | ICD-10-CM

## 2023-04-11 DIAGNOSIS — I10 Essential (primary) hypertension: Secondary | ICD-10-CM

## 2023-04-11 DIAGNOSIS — E118 Type 2 diabetes mellitus with unspecified complications: Secondary | ICD-10-CM | POA: Diagnosis not present

## 2023-04-11 DIAGNOSIS — Z7984 Long term (current) use of oral hypoglycemic drugs: Secondary | ICD-10-CM

## 2023-04-11 DIAGNOSIS — N401 Enlarged prostate with lower urinary tract symptoms: Secondary | ICD-10-CM | POA: Diagnosis not present

## 2023-04-11 DIAGNOSIS — Z0000 Encounter for general adult medical examination without abnormal findings: Secondary | ICD-10-CM

## 2023-04-11 LAB — URINALYSIS, ROUTINE W REFLEX MICROSCOPIC
Bilirubin, UA: NEGATIVE
Glucose, UA: NEGATIVE
Ketones, UA: NEGATIVE
Nitrite, UA: NEGATIVE
Protein,UA: NEGATIVE
RBC, UA: NEGATIVE
Specific Gravity, UA: 1.025 (ref 1.005–1.030)
Urobilinogen, Ur: 0.2 mg/dL (ref 0.2–1.0)
pH, UA: 5.5 (ref 5.0–7.5)

## 2023-04-11 LAB — MICROALBUMIN, URINE WAIVED
Creatinine, Urine Waived: 300 mg/dL (ref 10–300)
Microalb, Ur Waived: 80 mg/L — ABNORMAL HIGH (ref 0–19)

## 2023-04-11 LAB — MICROSCOPIC EXAMINATION
Bacteria, UA: NONE SEEN
RBC, Urine: NONE SEEN /hpf (ref 0–2)

## 2023-04-11 LAB — BAYER DCA HB A1C WAIVED: HB A1C (BAYER DCA - WAIVED): 7.3 % — ABNORMAL HIGH (ref 4.8–5.6)

## 2023-04-11 MED ORDER — ACCU-CHEK GUIDE VI STRP
1.0000 | ORAL_STRIP | Freq: Three times a day (TID) | 3 refills | Status: AC
Start: 2023-04-11 — End: ?

## 2023-04-11 MED ORDER — SIMVASTATIN 20 MG PO TABS: 20.0000 mg | ORAL_TABLET | Freq: Every day | ORAL | 1 refills | Status: DC

## 2023-04-11 MED ORDER — BENAZEPRIL HCL 40 MG PO TABS: 40.0000 mg | ORAL_TABLET | Freq: Every day | ORAL | 1 refills | Status: DC

## 2023-04-11 MED ORDER — METFORMIN HCL ER 500 MG PO TB24: 1000.0000 mg | ORAL_TABLET | Freq: Two times a day (BID) | ORAL | 1 refills | Status: DC | PRN

## 2023-04-11 NOTE — Assessment & Plan Note (Signed)
Under good control on current regimen. Continue current regimen. Continue to monitor. Call with any concerns. Refills given. Labs drawn today.   

## 2023-04-11 NOTE — Assessment & Plan Note (Signed)
Stable with A1c of 6.5. Continue diet and exercise and current regimen. Call with any concerns.

## 2023-04-11 NOTE — Progress Notes (Signed)
BP 129/81   Pulse 70   Temp 97.9 F (36.6 C) (Oral)   Ht 5' 5.35" (1.66 m)   Wt 210 lb (95.3 kg)   SpO2 98%   BMI 34.57 kg/m    Subjective:    Patient ID: Ralph Chapman, male    DOB: 04/10/1946, 77 y.o.   MRN: [id number]  HPI: Ralph Chapman is a 77 y.o. male presenting on 04/11/2023 for comprehensive medical examination. Current medical complaints include:  DIABETES Hypoglycemic episodes:no Polydipsia/polyuria: no Visual disturbance: no Chest pain: no Paresthesias: no Glucose Monitoring: yes Taking Insulin?: no Blood Pressure Monitoring: not checking Retinal Examination: Up to Date Foot Exam: Up to Date Diabetic Education: Completed Pneumovax: Up to Date Influenza: Up to Date Aspirin: no  HYPERTENSION / HYPERLIPIDEMIA Satisfied with current treatment? yes Duration of hypertension: chronic BP monitoring frequency: not checking BP medication side effects: no Past BP meds: benazepril Duration of hyperlipidemia: chronic Cholesterol medication side effects: no Cholesterol supplements: none Past cholesterol medications: simvastatin Medication compliance: excellent compliance Aspirin: no Recent stressors: no Recurrent headaches: no Visual changes: no Palpitations: no Dyspnea: no Chest pain: no Lower extremity edema: no Dizzy/lightheaded: no  He currently lives with: wife Interim Problems from his last visit: no  Depression Screen done today and results listed below:     04/11/2023    9:15 AM 01/12/2023    8:18 AM 11/29/2022   10:16 AM 10/12/2022    8:20 AM 04/22/2022   10:02 AM  Depression screen PHQ 2/9  Decreased Interest 0 0 0 0 0  Down, Depressed, Hopeless 0 0 0 0 0  PHQ - 2 Score 0 0 0 0 0  Altered sleeping 0 0 0 0 0  Tired, decreased energy 0 0 0 0 0  Change in appetite 0 0 0 0 0  Feeling bad or failure about yourself  0 0 0 0 0  Trouble concentrating 0 0 0 0 0  Moving slowly or fidgety/restless 0 0 0 0 0  Suicidal thoughts 0 0 0 0 0  PHQ-9  Score 0 0 0 0 0  Difficult doing work/chores Not difficult at all Not difficult at all   Not difficult at all    Past Medical History:  Past Medical History:  Diagnosis Date   Allergy Don't know   Diabetes mellitus without complication (HCC)    Hyperlipidemia    Hypertension     Surgical History:  Past Surgical History:  Procedure Laterality Date   APPENDECTOMY     COLON SURGERY  2017   Colon cancer surgery   COLONOSCOPY WITH PROPOFOL N/A 09/05/2016   Procedure: COLONOSCOPY WITH PROPOFOL;  Surgeon: Midge Minium, MD;  Location: [facility];  Service: Endoscopy;  Laterality: N/A;  Diabetic - oral meds   POLYPECTOMY  09/05/2016   Procedure: POLYPECTOMY;  Surgeon: Midge Minium, MD;  Location: [facility];  Service: Endoscopy;;   TONSILLECTOMY      Medications:  Current Outpatient Medications on File Prior to Visit  Medication Sig   Cholecalciferol 125 MCG (5000 UT) TABS Take by mouth.   glucosamine-chondroitin 500-400 MG tablet Take by mouth.   No current facility-administered medications on file prior to visit.    Allergies:  Allergies  Allergen Reactions   Aspirin Hives, Shortness Of Breath and Swelling    Lips swell   Niacin And Related Swelling   Tramadol Nausea And Vomiting    Social History:  Social History   Socioeconomic History  Marital status: Married    Spouse name: Not on file   Number of children: Not on file   Years of education: Not on file   Highest education level: Associate degree: occupational, Scientist, product/process development, or vocational program  Occupational History   Not on file  Tobacco Use   Smoking status: Never   Smokeless tobacco: Never  Vaping Use   Vaping Use: Never used  Substance and Sexual Activity   Alcohol use: No   Drug use: No   Sexual activity: Yes    Birth control/protection: None  Other Topics Concern   Not on file  Social History Narrative   Not on file   Social Determinants of Health   Financial Resource Strain:  Low Risk  (01/08/2023)   Overall Financial Resource Strain (CARDIA)    Difficulty of Paying Living Expenses: Not hard at all  Food Insecurity: Unknown (01/08/2023)   Hunger Vital Sign    Worried About Running Out of Food in the Last Year: Patient declined    Ran Out of Food in the Last Year: Never true  Transportation Needs: No Transportation Needs (01/08/2023)   PRAPARE - Administrator, Civil Service (Medical): No    Lack of Transportation (Non-Medical): No  Physical Activity: Sufficiently Active (01/08/2023)   Exercise Vital Sign    Days of Exercise per Week: 6 days    Minutes of Exercise per Session: 40 min  Stress: No Stress Concern Present (01/08/2023)   [employer] of Occupational Health - Occupational Stress Questionnaire    Feeling of Stress : Not at all  Social Connections: Socially Integrated (01/08/2023)   Social Connection and Isolation Panel [NHANES]    Frequency of Communication with Friends and Family: More than three times a week    Frequency of Social Gatherings with Friends and Family: Three times a week    Attends Religious Services: More than 4 times per year    Active Member of Clubs or Organizations: Yes    Attends Banker Meetings: More than 4 times per year    Marital Status: Married  Catering manager Violence: Not At Risk (11/29/2022)   Humiliation, Afraid, Rape, and Kick questionnaire    Fear of Current or Ex-Partner: No    Emotionally Abused: No    Physically Abused: No    Sexually Abused: No   Social History   Tobacco Use  Smoking Status Never  Smokeless Tobacco Never   Social History   Substance and Sexual Activity  Alcohol Use No    Family History:  Family History  Problem Relation Age of Onset   Cancer Mother    Heart attack Father 74   Heart disease Brother     Past medical history, surgical history, medications, allergies, family history and social history reviewed with patient today and changes made to  appropriate areas of the chart.   Review of Systems  Constitutional: Negative.   HENT:  Positive for congestion. Negative for ear discharge, ear pain, hearing loss, nosebleeds, sinus pain, sore throat and tinnitus.   Eyes: Negative.   Respiratory: Negative.  Negative for stridor.   Cardiovascular: Negative.   Gastrointestinal: Negative.   Genitourinary: Negative.   Musculoskeletal: Negative.   Skin: Negative.   Neurological: Negative.   Endo/Heme/Allergies: Negative.   Psychiatric/Behavioral: Negative.     All other ROS negative except what is listed above and in the HPI.      Objective:    BP 129/81   Pulse 70  Temp 97.9 F (36.6 C) (Oral)   Ht 5' 5.35" (1.66 m)   Wt 210 lb (95.3 kg)   SpO2 98%   BMI 34.57 kg/m   Wt Readings from Last 3 Encounters:  04/11/23 210 lb (95.3 kg)  01/12/23 210 lb 1.6 oz (95.3 kg)  11/29/22 210 lb (95.3 kg)    Physical Exam Vitals and nursing note reviewed.  Constitutional:      General: He is not in acute distress.    Appearance: Normal appearance. He is obese. He is not ill-appearing, toxic-appearing or diaphoretic.  HENT:     Head: Normocephalic and atraumatic.     Right Ear: Tympanic membrane, ear canal and external ear normal. There is no impacted cerumen.     Left Ear: Tympanic membrane, ear canal and external ear normal. There is no impacted cerumen.     Nose: Nose normal. No congestion or rhinorrhea.     Mouth/Throat:     Mouth: Mucous membranes are moist.     Pharynx: Oropharynx is clear. No oropharyngeal exudate or posterior oropharyngeal erythema.  Eyes:     General: No scleral icterus.       Right eye: No discharge.        Left eye: No discharge.     Extraocular Movements: Extraocular movements intact.     Conjunctiva/sclera: Conjunctivae normal.     Pupils: Pupils are equal, round, and reactive to light.  Neck:     Vascular: No carotid bruit.  Cardiovascular:     Rate and Rhythm: Normal rate and regular rhythm.      Pulses: Normal pulses.     Heart sounds: No murmur heard.    No friction rub. No gallop.  Pulmonary:     Effort: Pulmonary effort is normal. No respiratory distress.     Breath sounds: Normal breath sounds. No stridor. No wheezing, rhonchi or rales.  Chest:     Chest wall: No tenderness.  Abdominal:     General: Abdomen is flat. Bowel sounds are normal. There is no distension.     Palpations: Abdomen is soft. There is no mass.     Tenderness: There is no abdominal tenderness. There is no right CVA tenderness, left CVA tenderness, guarding or rebound.     Hernia: No hernia is present.  Genitourinary:    Comments: Genital exam deferred with shared decision making Musculoskeletal:        General: No swelling, tenderness, deformity or signs of injury.     Cervical back: Normal range of motion and neck supple. No rigidity. No muscular tenderness.     Right lower leg: No edema.     Left lower leg: No edema.  Lymphadenopathy:     Cervical: No cervical adenopathy.  Skin:    General: Skin is warm and dry.     Capillary Refill: Capillary refill takes less than 2 seconds.     Coloration: Skin is not jaundiced or pale.     Findings: No bruising, erythema, lesion or rash.  Neurological:     General: No focal deficit present.     Mental Status: He is alert and oriented to person, place, and time.     Cranial Nerves: No cranial nerve deficit.     Sensory: No sensory deficit.     Motor: No weakness.     Coordination: Coordination normal.     Gait: Gait normal.     Deep Tendon Reflexes: Reflexes normal.  Psychiatric:  Mood and Affect: Mood normal.        Behavior: Behavior normal.        Thought Content: Thought content normal.        Judgment: Judgment normal.     Results for orders placed or performed in visit on 01/12/23  Bayer DCA Hb A1c Waived  Result Value Ref Range   HB A1C (BAYER DCA - WAIVED) 7.5 (H) 4.8 - 5.6 %      Assessment & Plan:   Problem List Items Addressed  This Visit       Cardiovascular and Mediastinum   Hypertension    Under good control on current regimen. Continue current regimen. Continue to monitor. Call with any concerns. Refills given. Labs drawn today.       Relevant Medications   benazepril (LOTENSIN) 40 MG tablet   simvastatin (ZOCOR) 20 MG tablet   Other Relevant Orders   Comprehensive metabolic panel   CBC with Differential/Platelet   TSH   Urinalysis, Routine w reflex microscopic   Microalbumin, Urine Waived     Endocrine   Controlled diabetes mellitus type 2 with complications (HCC)    Stable with A1c of 6.5. Continue diet and exercise and current regimen. Call with any concerns.       Relevant Medications   benazepril (LOTENSIN) 40 MG tablet   metFORMIN (GLUCOPHAGE-XR) 500 MG 24 hr tablet   simvastatin (ZOCOR) 20 MG tablet   glucose blood (ACCU-CHEK GUIDE) test strip   Other Relevant Orders   Bayer DCA Hb A1c Waived   Comprehensive metabolic panel   CBC with Differential/Platelet   Urinalysis, Routine w reflex microscopic   Microalbumin, Urine Waived     Genitourinary   Benign localized hyperplasia of prostate with urinary obstruction    Under good control on current regimen. Continue current regimen. Continue to monitor. Call with any concerns. Refills given. Labs drawn today.      Relevant Orders   Comprehensive metabolic panel   CBC with Differential/Platelet   PSA     Other   Hyperlipidemia    Under good control on current regimen. Continue current regimen. Continue to monitor. Call with any concerns. Refills given. Labs drawn today.      Relevant Medications   benazepril (LOTENSIN) 40 MG tablet   simvastatin (ZOCOR) 20 MG tablet   Other Relevant Orders   Comprehensive metabolic panel   CBC with Differential/Platelet   Lipid Panel w/o Chol/HDL Ratio   Elevated prostate specific antigen (PSA)    Rechecking labs today. Await results. Treat as needed.       Relevant Orders   Comprehensive  metabolic panel   CBC with Differential/Platelet   PSA   Other Visit Diagnoses     Routine general medical examination at a health care facility    -  Primary   Vaccines up to date. Screening labs checked today. Colonoscopy up to date. Continue diet and exercise. Call with any concerns.       LABORATORY TESTING:  Health maintenance labs ordered today as discussed above.   The natural history of prostate cancer and ongoing controversy regarding screening and potential treatment outcomes of prostate cancer has been discussed with the patient. The meaning of a false positive PSA and a false negative PSA has been discussed. He indicates understanding of the limitations of this screening test and wishes to proceed with screening PSA testing.   IMMUNIZATIONS:   - Tdap: Tetanus vaccination status reviewed: last tetanus booster within 10  years. - Influenza: Up to date - Pneumovax: Up to date - Prevnar: Up to date - COVID: Up to date - HPV: Not applicable - Shingrix vaccine: Up to date  SCREENING: - Colonoscopy: Up to date  Discussed with patient purpose of the colonoscopy is to detect colon cancer at curable precancerous or early stages    PATIENT COUNSELING:    Sexuality: Discussed sexually transmitted diseases, partner selection, use of condoms, avoidance of unintended pregnancy  and contraceptive alternatives.   Advised to avoid cigarette smoking.  I discussed with the patient that most people either abstain from alcohol or drink within safe limits (<=14/week and <=4 drinks/occasion for males, <=7/weeks and <= 3 drinks/occasion for females) and that the risk for alcohol disorders and other health effects rises proportionally with the number of drinks per week and how often a drinker exceeds daily limits.  Discussed cessation/primary prevention of drug use and availability of treatment for abuse.   Diet: Encouraged to adjust caloric intake to maintain  or achieve ideal body weight,  to reduce intake of dietary saturated fat and total fat, to limit sodium intake by avoiding high sodium foods and not adding table salt, and to maintain adequate dietary potassium and calcium preferably from fresh fruits, vegetables, and low-fat dairy products.    stressed the importance of regular exercise  Injury prevention: Discussed safety belts, safety helmets, smoke detector, smoking near bedding or upholstery.   Dental health: Discussed importance of regular tooth brushing, flossing, and dental visits.   Follow up plan: NEXT PREVENTATIVE PHYSICAL DUE IN 1 YEAR. Return in about 3 months (around 07/12/2023).

## 2023-04-11 NOTE — Assessment & Plan Note (Signed)
Rechecking labs today. Await results. Treat as needed.  °

## 2023-04-11 NOTE — Addendum Note (Signed)
Addended by: Dorcas Carrow on: 04/11/2023 09:54 AM   Modules accepted: Orders

## 2023-04-12 LAB — COMPREHENSIVE METABOLIC PANEL
ALT: 11 IU/L (ref 0–44)
AST: 19 IU/L (ref 0–40)
Albumin: 4.6 g/dL (ref 3.8–4.8)
Alkaline Phosphatase: 63 IU/L (ref 44–121)
BUN/Creatinine Ratio: 14 (ref 10–24)
BUN: 18 mg/dL (ref 8–27)
Bilirubin Total: 0.6 mg/dL (ref 0.0–1.2)
CO2: 19 mmol/L — ABNORMAL LOW (ref 20–29)
Calcium: 9.3 mg/dL (ref 8.6–10.2)
Chloride: 106 mmol/L (ref 96–106)
Creatinine, Ser: 1.31 mg/dL — ABNORMAL HIGH (ref 0.76–1.27)
Globulin, Total: 2.2 g/dL (ref 1.5–4.5)
Glucose: 120 mg/dL — ABNORMAL HIGH (ref 70–99)
Potassium: 5 mmol/L (ref 3.5–5.2)
Sodium: 140 mmol/L (ref 134–144)
Total Protein: 6.8 g/dL (ref 6.0–8.5)
eGFR: 57 mL/min/{1.73_m2} — ABNORMAL LOW (ref >59)

## 2023-04-12 LAB — CBC WITH DIFFERENTIAL/PLATELET
Basophils Absolute: 0.1 10*3/uL (ref 0.0–0.2)
Basos: 2 %
EOS (ABSOLUTE): 0.4 10*3/uL (ref 0.0–0.4)
Eos: 7 %
Hematocrit: 41.8 % (ref 37.5–51.0)
Hemoglobin: 13.1 g/dL (ref 13.0–17.7)
Immature Grans (Abs): 0 10*3/uL (ref 0.0–0.1)
Immature Granulocytes: 0 %
Lymphocytes Absolute: 1.2 10*3/uL (ref 0.7–3.1)
Lymphs: 22 %
MCH: 30.3 pg (ref 26.6–33.0)
MCHC: 31.3 g/dL — ABNORMAL LOW (ref 31.5–35.7)
MCV: 97 fL (ref 79–97)
Monocytes Absolute: 0.5 10*3/uL (ref 0.1–0.9)
Monocytes: 9 %
Neutrophils Absolute: 3.1 10*3/uL (ref 1.4–7.0)
Neutrophils: 60 %
Platelets: 274 10*3/uL (ref 150–450)
RBC: 4.32 x10E6/uL (ref 4.14–5.80)
RDW: 12.3 % (ref 11.6–15.4)
WBC: 5.3 10*3/uL (ref 3.4–10.8)

## 2023-04-12 LAB — LIPID PANEL W/O CHOL/HDL RATIO
Cholesterol, Total: 165 mg/dL (ref 100–199)
HDL: 45 mg/dL (ref >39)
LDL Chol Calc (NIH): 86 mg/dL (ref 0–99)
Triglycerides: 201 mg/dL — ABNORMAL HIGH (ref 0–149)
VLDL Cholesterol Cal: 34 mg/dL (ref 5–40)

## 2023-04-12 LAB — TSH: TSH: 2.49 u[IU]/mL (ref 0.450–4.500)

## 2023-04-12 LAB — PSA: Prostate Specific Ag, Serum: 4.9 ng/mL — ABNORMAL HIGH (ref 0.0–4.0)

## 2023-04-13 LAB — URINE CULTURE

## 2023-07-13 ENCOUNTER — Encounter: Payer: Self-pay | Admitting: Family Medicine

## 2023-07-13 ENCOUNTER — Ambulatory Visit: Payer: Medicare PPO | Admitting: Family Medicine

## 2023-07-13 VITALS — BP 106/70 | HR 73 | Ht 65.0 in | Wt 207.0 lb

## 2023-07-13 DIAGNOSIS — R972 Elevated prostate specific antigen [PSA]: Secondary | ICD-10-CM

## 2023-07-13 DIAGNOSIS — Z23 Encounter for immunization: Secondary | ICD-10-CM | POA: Diagnosis not present

## 2023-07-13 DIAGNOSIS — Z7984 Long term (current) use of oral hypoglycemic drugs: Secondary | ICD-10-CM

## 2023-07-13 DIAGNOSIS — E7800 Pure hypercholesterolemia, unspecified: Secondary | ICD-10-CM | POA: Diagnosis not present

## 2023-07-13 DIAGNOSIS — E118 Type 2 diabetes mellitus with unspecified complications: Secondary | ICD-10-CM

## 2023-07-13 DIAGNOSIS — I10 Essential (primary) hypertension: Secondary | ICD-10-CM | POA: Diagnosis not present

## 2023-07-13 MED ORDER — ACCU-CHEK GUIDE VI STRP
1.0000 | ORAL_STRIP | Freq: Three times a day (TID) | 3 refills | Status: DC
Start: 2023-07-13 — End: 2024-08-14

## 2023-07-13 NOTE — Assessment & Plan Note (Signed)
Rechecking labs today. Await results. Treat as needed.  °

## 2023-07-13 NOTE — Assessment & Plan Note (Signed)
Under good control on current regimen. Continue current regimen. Continue to monitor. Call with any concerns. Refills up to date. Labs drawn today.  

## 2023-07-13 NOTE — Progress Notes (Signed)
BP 106/70   Pulse 73   Ht 5\' 5"  (1.651 m)   Wt 207 lb (93.9 kg)   SpO2 95%   BMI 34.45 kg/m    Subjective:    Patient ID: Ralph Chapman, male    DOB: 06/16/1946, 77 y.o.   MRN: [id number]  HPI: Ralph Chapman is a 77 y.o. male  Chief Complaint  Patient presents with   Medical Management of Chronic Issues   DIABETES Hypoglycemic episodes:no Polydipsia/polyuria: no Visual disturbance: no Chest pain: no Paresthesias: no Glucose Monitoring: yes  Accucheck frequency: Daily  Fasting glucose: 110s in the AM Taking Insulin?: no Blood Pressure Monitoring: not checking Retinal Examination: Up to Date Foot Exam: Up to Date Diabetic Education: Completed Pneumovax: Up to Date Influenza: Up to Date Aspirin: no  HYPERTENSION / HYPERLIPIDEMIA Satisfied with current treatment? yes Duration of hypertension: chronic BP monitoring frequency: not checking BP medication side effects: no Past BP meds: benazepril Duration of hyperlipidemia: chronic Cholesterol medication side effects: no Cholesterol supplements: none Past cholesterol medications: simvastin Medication compliance: excellent compliance Aspirin: no Recent stressors: no Recurrent headaches: no Visual changes: no Palpitations: no Dyspnea: no Chest pain: no Lower extremity edema: no Dizzy/lightheaded: no  Relevant past medical, surgical, family and social history reviewed and updated as indicated. Interim medical history since our last visit reviewed. Allergies and medications reviewed and updated.  Review of Systems  Constitutional: Negative.   Respiratory: Negative.    Cardiovascular: Negative.   Gastrointestinal: Negative.   Musculoskeletal: Negative.   Psychiatric/Behavioral: Negative.      Per HPI unless specifically indicated above     Objective:    BP 106/70   Pulse 73   Ht 5\' 5"  (1.651 m)   Wt 207 lb (93.9 kg)   SpO2 95%   BMI 34.45 kg/m   Wt Readings from Last 3 Encounters:  07/13/23  207 lb (93.9 kg)  04/11/23 210 lb (95.3 kg)  01/12/23 210 lb 1.6 oz (95.3 kg)    Physical Exam Vitals and nursing note reviewed.  Constitutional:      General: He is not in acute distress.    Appearance: Normal appearance. He is obese. He is not ill-appearing, toxic-appearing or diaphoretic.  HENT:     Head: Normocephalic and atraumatic.     Right Ear: External ear normal.     Left Ear: External ear normal.     Nose: Nose normal.     Mouth/Throat:     Mouth: Mucous membranes are moist.     Pharynx: Oropharynx is clear.  Eyes:     General: No scleral icterus.       Right eye: No discharge.        Left eye: No discharge.     Extraocular Movements: Extraocular movements intact.     Conjunctiva/sclera: Conjunctivae normal.     Pupils: Pupils are equal, round, and reactive to light.  Cardiovascular:     Rate and Rhythm: Normal rate and regular rhythm.     Pulses: Normal pulses.     Heart sounds: Normal heart sounds. No murmur heard.    No friction rub. No gallop.  Pulmonary:     Effort: Pulmonary effort is normal. No respiratory distress.     Breath sounds: Normal breath sounds. No stridor. No wheezing, rhonchi or rales.  Chest:     Chest wall: No tenderness.  Musculoskeletal:        General: Normal range of motion.     Cervical back:  Normal range of motion and neck supple.  Skin:    General: Skin is warm and dry.     Capillary Refill: Capillary refill takes less than 2 seconds.     Coloration: Skin is not jaundiced or pale.     Findings: No bruising, erythema, lesion or rash.  Neurological:     General: No focal deficit present.     Mental Status: He is alert and oriented to person, place, and time. Mental status is at baseline.  Psychiatric:        Mood and Affect: Mood normal.        Behavior: Behavior normal.        Thought Content: Thought content normal.        Judgment: Judgment normal.     Results for orders placed or performed in visit on 04/11/23  Urine  Culture   Specimen: Urine   UR  Result Value Ref Range   Urine Culture, Routine Final report    Organism ID, Bacteria Comment   Microscopic Examination   BLD  Result Value Ref Range   WBC, UA 0-5 0 - 5 /hpf   RBC, Urine None seen 0 - 2 /hpf   Epithelial Cells (non renal) 0-10 0 - 10 /hpf   Bacteria, UA None seen None seen/Few  Bayer DCA Hb A1c Waived  Result Value Ref Range   HB A1C (BAYER DCA - WAIVED) 7.3 (H) 4.8 - 5.6 %  Comprehensive metabolic panel  Result Value Ref Range   Glucose 120 (H) 70 - 99 mg/dL   BUN 18 8 - 27 mg/dL   Creatinine, Ser 4.09 (H) 0.76 - 1.27 mg/dL   eGFR 57 (L) >81 XB/JYN/8.29   BUN/Creatinine Ratio 14 10 - 24   Sodium 140 134 - 144 mmol/L   Potassium 5.0 3.5 - 5.2 mmol/L   Chloride 106 96 - 106 mmol/L   CO2 19 (L) 20 - 29 mmol/L   Calcium 9.3 8.6 - 10.2 mg/dL   Total Protein 6.8 6.0 - 8.5 g/dL   Albumin 4.6 3.8 - 4.8 g/dL   Globulin, Total 2.2 1.5 - 4.5 g/dL   Bilirubin Total 0.6 0.0 - 1.2 mg/dL   Alkaline Phosphatase 63 44 - 121 IU/L   AST 19 0 - 40 IU/L   ALT 11 0 - 44 IU/L  CBC with Differential/Platelet  Result Value Ref Range   WBC 5.3 3.4 - 10.8 x10E3/uL   RBC 4.32 4.14 - 5.80 x10E6/uL   Hemoglobin 13.1 13.0 - 17.7 g/dL   Hematocrit 56.2 13.0 - 51.0 %   MCV 97 79 - 97 fL   MCH 30.3 26.6 - 33.0 pg   MCHC 31.3 (L) 31.5 - 35.7 g/dL   RDW 86.5 78.4 - 69.6 %   Platelets 274 150 - 450 x10E3/uL   Neutrophils 60 Not Estab. %   Lymphs 22 Not Estab. %   Monocytes 9 Not Estab. %   Eos 7 Not Estab. %   Basos 2 Not Estab. %   Neutrophils Absolute 3.1 1.4 - 7.0 x10E3/uL   Lymphocytes Absolute 1.2 0.7 - 3.1 x10E3/uL   Monocytes Absolute 0.5 0.1 - 0.9 x10E3/uL   EOS (ABSOLUTE) 0.4 0.0 - 0.4 x10E3/uL   Basophils Absolute 0.1 0.0 - 0.2 x10E3/uL   Immature Granulocytes 0 Not Estab. %   Immature Grans (Abs) 0.0 0.0 - 0.1 x10E3/uL  Lipid Panel w/o Chol/HDL Ratio  Result Value Ref Range   Cholesterol, Total 165 100 -  199 mg/dL   Triglycerides  324 (H) 0 - 149 mg/dL   HDL 45 >40 mg/dL   VLDL Cholesterol Cal 34 5 - 40 mg/dL   LDL Chol Calc (NIH) 86 0 - 99 mg/dL  PSA  Result Value Ref Range   Prostate Specific Ag, Serum 4.9 (H) 0.0 - 4.0 ng/mL  TSH  Result Value Ref Range   TSH 2.490 0.450 - 4.500 uIU/mL  Urinalysis, Routine w reflex microscopic  Result Value Ref Range   Specific Gravity, UA 1.025 1.005 - 1.030   pH, UA 5.5 5.0 - 7.5   Color, UA Yellow Yellow   Appearance Ur Clear Clear   Leukocytes,UA Trace (A) Negative   Protein,UA Negative Negative/Trace   Glucose, UA Negative Negative   Ketones, UA Negative Negative   RBC, UA Negative Negative   Bilirubin, UA Negative Negative   Urobilinogen, Ur 0.2 0.2 - 1.0 mg/dL   Nitrite, UA Negative Negative   Microscopic Examination See below:   Microalbumin, Urine Waived  Result Value Ref Range   Microalb, Ur Waived 80 (H) 0 - 19 mg/L   Creatinine, Urine Waived 300 10 - 300 mg/dL   Microalb/Creat Ratio 30-300 (H) <30 mg/g      Assessment & Plan:   Problem List Items Addressed This Visit       Cardiovascular and Mediastinum   Hypertension    Under good control on current regimen. Continue current regimen. Continue to monitor. Call with any concerns. Refills up to date. Labs drawn today.      Relevant Orders   CBC with Differential/Platelet   Comprehensive metabolic panel     Endocrine   Controlled diabetes mellitus type 2 with complications (HCC) - Primary    Rechecking labs today. Await results. Treat as needed.       Relevant Medications   glucose blood (ACCU-CHEK GUIDE) test strip   Other Relevant Orders   CBC with Differential/Platelet   Comprehensive metabolic panel   Hgb A1c w/o eAG     Other   Hyperlipidemia    Under good control on current regimen. Continue current regimen. Continue to monitor. Call with any concerns. Refills up to date. Labs drawn today.       Relevant Orders   CBC with Differential/Platelet   Comprehensive metabolic panel    Lipid Panel w/o Chol/HDL Ratio   Elevated prostate specific antigen (PSA)    Rechecking labs today. Await results. Treat as needed.       Relevant Orders   CBC with Differential/Platelet   Comprehensive metabolic panel   PSA   Other Visit Diagnoses     Needs flu shot       Relevant Orders   Flu Vaccine Trivalent High Dose (Fluad) (Completed)        Follow up plan: Return in about 3 months (around 10/13/2023), or 3-6 months, for Diabetic follow up.

## 2023-07-14 LAB — CBC WITH DIFFERENTIAL/PLATELET
Basophils Absolute: 0.1 10*3/uL (ref 0.0–0.2)
Basos: 2 %
EOS (ABSOLUTE): 0.3 10*3/uL (ref 0.0–0.4)
Eos: 4 %
Hematocrit: 40.8 % (ref 37.5–51.0)
Hemoglobin: 13.2 g/dL (ref 13.0–17.7)
Immature Grans (Abs): 0 10*3/uL (ref 0.0–0.1)
Immature Granulocytes: 1 %
Lymphocytes Absolute: 1.4 10*3/uL (ref 0.7–3.1)
Lymphs: 25 %
MCH: 31.7 pg (ref 26.6–33.0)
MCHC: 32.4 g/dL (ref 31.5–35.7)
MCV: 98 fL — ABNORMAL HIGH (ref 79–97)
Monocytes Absolute: 0.5 10*3/uL (ref 0.1–0.9)
Monocytes: 9 %
Neutrophils Absolute: 3.3 10*3/uL (ref 1.4–7.0)
Neutrophils: 59 %
Platelets: 259 10*3/uL (ref 150–450)
RBC: 4.17 x10E6/uL (ref 4.14–5.80)
RDW: 12.5 % (ref 11.6–15.4)
WBC: 5.6 10*3/uL (ref 3.4–10.8)

## 2023-07-14 LAB — COMPREHENSIVE METABOLIC PANEL
ALT: 13 [IU]/L (ref 0–44)
AST: 21 [IU]/L (ref 0–40)
Albumin: 4.5 g/dL (ref 3.8–4.8)
Alkaline Phosphatase: 65 [IU]/L (ref 44–121)
BUN/Creatinine Ratio: 15 (ref 10–24)
BUN: 20 mg/dL (ref 8–27)
Bilirubin Total: 0.6 mg/dL (ref 0.0–1.2)
CO2: 21 mmol/L (ref 20–29)
Calcium: 9.8 mg/dL (ref 8.6–10.2)
Chloride: 104 mmol/L (ref 96–106)
Creatinine, Ser: 1.32 mg/dL — ABNORMAL HIGH (ref 0.76–1.27)
Globulin, Total: 2.3 g/dL (ref 1.5–4.5)
Glucose: 121 mg/dL — ABNORMAL HIGH (ref 70–99)
Potassium: 4.8 mmol/L (ref 3.5–5.2)
Sodium: 139 mmol/L (ref 134–144)
Total Protein: 6.8 g/dL (ref 6.0–8.5)
eGFR: 56 mL/min/{1.73_m2} — ABNORMAL LOW (ref >59)

## 2023-07-14 LAB — LIPID PANEL W/O CHOL/HDL RATIO
Cholesterol, Total: 153 mg/dL (ref 100–199)
HDL: 42 mg/dL (ref >39)
LDL Chol Calc (NIH): 77 mg/dL (ref 0–99)
Triglycerides: 202 mg/dL — ABNORMAL HIGH (ref 0–149)
VLDL Cholesterol Cal: 34 mg/dL (ref 5–40)

## 2023-07-14 LAB — HGB A1C W/O EAG: Hgb A1c MFr Bld: 7.1 % — ABNORMAL HIGH (ref 4.8–5.6)

## 2023-07-14 LAB — PSA: Prostate Specific Ag, Serum: 5.6 ng/mL — ABNORMAL HIGH (ref 0.0–4.0)

## 2023-08-04 ENCOUNTER — Telehealth: Payer: Self-pay | Admitting: Family Medicine

## 2023-08-04 NOTE — Telephone Encounter (Signed)
Copied from CRM [id number]. Topic: General - Other >> Aug 04, 2023  2:30 PM Turkey B wrote: Reason for CRM: pt called in about bill he received that he already paid in office. I got him with billing also in case they can assist

## 2023-12-05 ENCOUNTER — Ambulatory Visit (INDEPENDENT_AMBULATORY_CARE_PROVIDER_SITE_OTHER): Payer: Medicare PPO | Admitting: Emergency Medicine

## 2023-12-05 VITALS — Ht 67.0 in | Wt 207.0 lb

## 2023-12-05 DIAGNOSIS — Z0000 Encounter for general adult medical examination without abnormal findings: Secondary | ICD-10-CM | POA: Diagnosis not present

## 2023-12-05 NOTE — Progress Notes (Signed)
 Subjective:   Ralph Chapman is a 78 y.o. who presents for a Medicare Wellness preventive visit.  Visit Complete: Virtual I connected with  Ralph Chapman on 12/05/23 by a audio enabled telemedicine application and verified that I am speaking with the correct person using two identifiers.  Patient Location: Home  Provider Location: Office/Clinic  I discussed the limitations of evaluation and management by telemedicine. The patient expressed understanding and agreed to proceed.  Vital Signs: Because this visit was a virtual/telehealth visit, some criteria may be missing or patient reported. Any vitals not documented were not able to be obtained and vitals that have been documented are patient reported.  VideoDeclined- This patient declined Librarian, academic. Therefore the visit was completed with audio only.  AWV Questionnaire: Yes: Patient Medicare AWV questionnaire was completed by the patient on 12/01/23; I have confirmed that all information answered by patient is correct and no changes since this date.  Cardiac Risk Factors include: advanced age (>84men, >58 women);male gender;diabetes mellitus;dyslipidemia;hypertension;obesity (BMI >30kg/m2)     Objective:    Today's Vitals   12/05/23 0913  Weight: 207 lb (93.9 kg)  Height: 5\' 7"  (1.702 m)   Body mass index is 32.42 kg/m.     12/05/2023    9:21 AM 11/29/2022   10:18 AM 11/16/2021    9:05 AM 09/28/2020    2:32 PM 09/25/2019    8:57 AM 09/13/2018    3:24 PM 09/01/2017    8:09 AM  Advanced Directives  Does Patient Have a Medical Advance Directive? No No No No No No No  Would patient like information on creating a medical advance directive? Yes (MAU/Ambulatory/Procedural Areas - Information given) No - Patient declined No - Patient declined   Yes (MAU/Ambulatory/Procedural Areas - Information given) Yes (MAU/Ambulatory/Procedural Areas - Information given)    Current Medications  (verified) Outpatient Encounter Medications as of 12/05/2023  Medication Sig   benazepril (LOTENSIN) 40 MG tablet Take 1 tablet (40 mg total) by mouth daily.   Cholecalciferol 125 MCG (5000 UT) TABS Take by mouth.   glucosamine-chondroitin 500-400 MG tablet Take by mouth.   glucose blood (ACCU-CHEK GUIDE) test strip 1 each by Other route 3 (three) times daily. Use as instructed   metFORMIN (GLUCOPHAGE-XR) 500 MG 24 hr tablet Take 2 tablets (1,000 mg total) by mouth 2 (two) times daily as needed.   simvastatin (ZOCOR) 20 MG tablet Take 1 tablet (20 mg total) by mouth at bedtime.   No facility-administered encounter medications on file as of 12/05/2023.    Allergies (verified) Aspirin, Niacin and related, and Tramadol   History: Past Medical History:  Diagnosis Date   Allergy Don't know   Diabetes mellitus without complication (HCC)    Hyperlipidemia    Hypertension    Past Surgical History:  Procedure Laterality Date   APPENDECTOMY     COLON SURGERY  2017   Colon cancer surgery   COLONOSCOPY WITH PROPOFOL N/A 09/05/2016   Procedure: COLONOSCOPY WITH PROPOFOL;  Surgeon: Midge Minium, MD;  [facility];  Service: Endoscopy;  Laterality: N/A;  Diabetic - oral meds   POLYPECTOMY  09/05/2016   Procedure: POLYPECTOMY;  Surgeon: Midge Minium, MD;  Location: [facility];  Service: Endoscopy;;   TONSILLECTOMY     Family History  Problem Relation Age of Onset   Cancer Mother    Heart attack Father 69   Heart disease Brother    Social History   Socioeconomic History  Marital status: Married    Spouse name: Ralph Chapman   Number of children: 2   Years of education: Not on file   Highest education level: Associate degree: occupational, Scientist, product/process development, or vocational program  Occupational History   Occupation: retired  Tobacco Use   Smoking status: Never   Smokeless tobacco: Never  Vaping Use   Vaping status: Never Used  Substance and Sexual Activity   Alcohol use: No    Drug use: No   Sexual activity: Yes    Birth control/protection: None  Other Topics Concern   Not on file  Social History Narrative   Not on file   Social Drivers of Health   Financial Resource Strain: Low Risk  (12/05/2023)   Overall Financial Resource Strain (CARDIA)    Difficulty of Paying Living Expenses: Not hard at all  Food Insecurity: No Food Insecurity (12/05/2023)   Hunger Vital Sign    Worried About Running Out of Food in the Last Year: Never true    Ran Out of Food in the Last Year: Never true  Transportation Needs: No Transportation Needs (12/05/2023)   PRAPARE - Administrator, Civil Service (Medical): No    Lack of Transportation (Non-Medical): No  Physical Activity: Sufficiently Active (12/05/2023)   Exercise Vital Sign    Days of Exercise per Week: 5 days    Minutes of Exercise per Session: 30 min  Stress: No Stress Concern Present (12/05/2023)   [employer] of Occupational Health - Occupational Stress Questionnaire    Feeling of Stress : Not at all  Social Connections: Socially Integrated (12/05/2023)   Social Connection and Isolation Panel [NHANES]    Frequency of Communication with Friends and Family: More than three times a week    Frequency of Social Gatherings with Friends and Family: Once a week    Attends Religious Services: 1 to 4 times per year    Active Member of [employer] or Organizations: Yes    Attends Banker Meetings: 1 to 4 times per year    Marital Status: Married    Tobacco Counseling Counseling given: Not Answered    Clinical Intake:  Pre-visit preparation completed: Yes  Pain : No/denies pain     BMI - recorded: 32.42 Nutritional Status: BMI > 30  Obese Nutritional Risks: None Diabetes: Yes CBG done?: No (FBS 120 per patient) Did pt. bring in CBG monitor from home?: No  How often do you need to have someone help you when you read instructions, pamphlets, or other written materials from your doctor  or pharmacy?: 1 - Never  Interpreter Needed?: No  Information entered by :: Tora Kindred, CMA   Activities of Daily Living     12/05/2023    9:16 AM 12/01/2023    8:52 AM  In your present state of health, do you have any difficulty performing the following activities:  Hearing? 0 0  Vision? 0 0  Difficulty concentrating or making decisions? 0 0  Walking or climbing stairs? 0 0  Dressing or bathing? 0 0  Doing errands, shopping? 0 0  Preparing Food and eating ? N N  Using the Toilet? N N  In the past six months, have you accidently leaked urine? N N  Do you have problems with loss of bowel control? N N  Managing your Medications? N N  Managing your Finances? N N  Housekeeping or managing your Housekeeping? N N    Patient Care Team: Josia, Cueva, DO as  PCP - General (Family Medicine) Midge Minium, MD as Consulting Physician (Gastroenterology) Verne Carrow, OD (Optometry)  Indicate any recent Medical Services you may have received from other than [facility] providers in the past year (date may be approximate).     Assessment:   This is a routine wellness examination for Muzammil.  Hearing/Vision screen Hearing Screening - Comments:: Denies hearing loss Vision Screening - Comments:: Needs eye exam, Dr. Verne Carrow @ [facility] in [place]. Patient will call to schedule   Goals Addressed               This Visit's Progress     Weight (lb) < 185 lb (83.9 kg) (pt-stated)   207 lb (93.9 kg)      Depression Screen      12/05/2023    9:19 AM 07/13/2023    8:16 AM 04/11/2023    9:15 AM 01/12/2023    8:18 AM 11/29/2022   10:16 AM 10/12/2022    8:20 AM 04/22/2022   10:02 AM  PHQ 2/9 Scores  PHQ - 2 Score 0 0 0 0 0 0 0  PHQ- 9 Score   0 0 0 0 0    Fall Risk      12/05/2023    9:23 AM 12/01/2023    8:52 AM 04/11/2023    9:15 AM 01/12/2023    8:18 AM 11/29/2022   10:20 AM  Fall Risk   Falls in the past year? 0 0 0 0 0  Number falls in past yr: 0 0 0 0 0  Injury with Fall? 0 0 0  0 0  Risk for fall due to : No Fall Risks  No Fall Risks No Fall Risks No Fall Risks  Follow up Falls prevention discussed;Falls evaluation completed  Falls evaluation completed Falls evaluation completed Falls prevention discussed;Falls evaluation completed    MEDICARE RISK AT HOME:   Medicare Risk at Home Any stairs in or around the home?: Yes If so, are there any without handrails?: No Home free of loose throw rugs in walkways, pet beds, electrical cords, etc?: Yes Adequate lighting in your home to reduce risk of falls?: Yes Life alert?: No Use of a cane, walker or w/c?: No Grab bars in the bathroom?: No Shower chair or bench in shower?: No Elevated toilet seat or a handicapped toilet?: Yes  TIMED UP AND GO:  Was the test performed?  No  Cognitive Function: 6CIT completed        12/05/2023    9:24 AM 11/29/2022   10:29 AM 09/28/2020    2:36 PM 09/13/2018    3:28 PM 09/01/2017    8:13 AM  6CIT Screen  What Year? 0 points 0 points 0 points 0 points 0 points  What month? 0 points 0 points 0 points 0 points 0 points  What time? 0 points 0 points 0 points 0 points 0 points  Count back from 20 0 points 0 points 0 points 0 points 0 points  Months in reverse 0 points 0 points 0 points 0 points 0 points  Repeat phrase 0 points 0 points 0 points 2 points 0 points  Total Score 0 points 0 points 0 points 2 points 0 points    Immunizations Immunization History  Administered Date(s) Administered   Fluad Quad(high Dose 65+) 09/28/2020, 10/05/2021, 10/12/2022   Fluad Trivalent(High Dose 65+) 07/13/2023   Influenza, High Dose Seasonal PF 08/01/2016, 09/01/2017, 09/13/2018   Influenza,inj,quad, With Preservative 08/01/2016, 09/01/2017, 09/13/2018, 06/26/2019   Influenza-Unspecified  08/17/2015, 08/01/2016, 09/01/2017, 09/13/2018   Moderna Sars-Covid-2 Vaccination 11/15/2019, 12/17/2019, 08/06/2020   Pneumococcal Conjugate-13 05/15/2014   Pneumococcal Polysaccharide-23 04/13/2013    Pneumococcal-Unspecified 03/21/2007, 04/10/2013   Tdap 05/15/2014   Zoster Recombinant(Shingrix) 11/23/2018, 12/25/2018, 04/01/2019   Zoster, Live 04/15/2013    Screening Tests Health Maintenance  Topic Date Due   FOOT EXAM  10/13/2023   OPHTHALMOLOGY EXAM  11/10/2023   HEMOGLOBIN A1C  01/11/2024   Diabetic kidney evaluation - Urine ACR  04/10/2024   DTaP/Tdap/Td (2 - Td or Tdap) 05/15/2024   Diabetic kidney evaluation - eGFR measurement  07/12/2024   Medicare Annual Wellness (AWV)  12/04/2024   Colonoscopy  11/10/2025   Pneumonia Vaccine 48+ Years old  Completed   INFLUENZA VACCINE  Completed   Hepatitis C Screening  Completed   Zoster Vaccines- Shingrix  Completed   HPV VACCINES  Aged Out   COVID-19 Vaccine  Discontinued    Health Maintenance  Health Maintenance Due  Topic Date Due   FOOT EXAM  10/13/2023   OPHTHALMOLOGY EXAM  11/10/2023   Health Maintenance Items Addressed: See Nurse Notes  Additional Screening:  Vision Screening: Recommended annual ophthalmology exams for early detection of glaucoma and other disorders of the eye.  Dental Screening: Recommended annual dental exams for proper oral hygiene  Community Resource Referral / Chronic Care Management: CRR required this visit?  No   CCM required this visit?  No     Plan:     I have personally reviewed and noted the following in the patient's chart:   Medical and social history Use of alcohol, tobacco or illicit drugs  Current medications and supplements including opioid prescriptions. Patient is not currently taking opioid prescriptions. Functional ability and status Nutritional status Physical activity Advanced directives List of other physicians Hospitalizations, surgeries, and ER visits in previous 12 months Vitals Screenings to include cognitive, depression, and falls Referrals and appointments  In addition, I have reviewed and discussed with patient certain preventive protocols,  quality metrics, and best practice recommendations. A written personalized care plan for preventive services as well as general preventive health recommendations were provided to patient.     Tora Kindred, CMA   12/05/2023   After Visit Summary: (MyChart) Due to this being a telephonic visit, the after visit summary with patients personalized plan was offered to patient via MyChart   Notes:  Needs DM eye exam. Patient states he will call to schedule. Needs DM foot exam at next OV on 02/07/24 Tdap will be due 05/2024 Declined Covid vaccines Declined DM & Nutrition education

## 2023-12-05 NOTE — Patient Instructions (Addendum)
 Ralph Chapman , Thank you for taking time to come for your Medicare Wellness Visit. I appreciate your ongoing commitment to your health goals. Please review the following plan we discussed and let me know if I can assist you in the future.   Referrals/Orders/Follow-Ups/Clinician Recommendations: Call and schedule a diabetic eye exam with Dr. Clearance Coots at your earliest convenience. You are due for a diabetic foot exam. This can be done at your next OV on 02/07/24. Your tetanus shot will be due 05/2024.  This is a list of the screening recommended for you and due dates:  Health Maintenance  Topic Date Due   Complete foot exam   10/13/2023   Eye exam for diabetics  11/10/2023   Hemoglobin A1C  01/11/2024   Yearly kidney health urinalysis for diabetes  04/10/2024   DTaP/Tdap/Td vaccine (2 - Td or Tdap) 05/15/2024   Yearly kidney function blood test for diabetes  07/12/2024   Medicare Annual Wellness Visit  12/04/2024   Colon Cancer Screening  11/10/2025   Pneumonia Vaccine  Completed   Flu Shot  Completed   Hepatitis C Screening  Completed   Zoster (Shingles) Vaccine  Completed   HPV Vaccine  Aged Out   COVID-19 Vaccine  Discontinued    Advanced directives: (ACP Link)Information on Advanced Care Planning can be found at [facility] Advance Health Care Directives Advance Health Care Directives ([email or web address] You may also get these forms at your doctor's office.  Once you have completed the forms, please bring a copy of your health care power of attorney and living will to the office to be added to your chart at your convenience.   Next Medicare Annual Wellness Visit scheduled for next year: Yes, 12/10/24 @ 9:20am (video visit)

## 2023-12-20 LAB — HM DIABETES EYE EXAM

## 2024-01-08 ENCOUNTER — Other Ambulatory Visit: Payer: Self-pay | Admitting: Family Medicine

## 2024-01-08 DIAGNOSIS — E7800 Pure hypercholesterolemia, unspecified: Secondary | ICD-10-CM

## 2024-01-08 DIAGNOSIS — I10 Essential (primary) hypertension: Secondary | ICD-10-CM

## 2024-01-09 NOTE — Telephone Encounter (Signed)
 Requested Prescriptions  Pending Prescriptions Disp Refills   simvastatin (ZOCOR) 20 MG tablet [Pharmacy Med Name: SIMVASTATIN 20 MG TABLET] 90 tablet 0    Sig: Take 1 tablet (20 mg total) by mouth at bedtime.     Cardiovascular:  Antilipid - Statins Failed - 01/09/2024 11:26 AM      Failed - Lipid Panel in normal range within the last 12 months    Cholesterol, Total  Date Value Ref Range Status  07/13/2023 153 100 - 199 mg/dL Final   Cholesterol Piccolo, Waived  Date Value Ref Range Status  12/27/2017 168 <200 mg/dL Final    Comment:                            Desirable                <200                         Borderline High      200- 239                         High                     >239    LDL Chol Calc (NIH)  Date Value Ref Range Status  07/13/2023 77 0 - 99 mg/dL Final   HDL  Date Value Ref Range Status  07/13/2023 42 >39 mg/dL Final   Triglycerides  Date Value Ref Range Status  07/13/2023 202 (H) 0 - 149 mg/dL Final   Triglycerides Piccolo,Waived  Date Value Ref Range Status  12/27/2017 241 (H) <150 mg/dL Final    Comment:                            Normal                   <150                         Borderline High     150 - 199                         High                200 - 499                         Very High                >499          Passed - Patient is not pregnant      Passed - Valid encounter within last 12 months    Recent Outpatient Visits   None     Future Appointments             In 4 weeks Nordmeyer, Megan P, DO [place]             metFORMIN (GLUCOPHAGE-XR) 500 MG 24 hr tablet [Pharmacy Med Name: METFORMIN HCL ER 500 MG TABLET] 360 tablet 0    Sig: Take 2 tablets (1,000 mg total) by mouth 2 (two) times daily as needed.     Endocrinology:  Diabetes - Biguanides Failed -  01/09/2024 11:26 AM      Failed - Cr in normal range and within 360 days    Creatinine, Ser  Date Value Ref Range Status   07/13/2023 1.32 (H) 0.76 - 1.27 mg/dL Final         Failed - eGFR in normal range and within 360 days    GFR calc Af Amer  Date Value Ref Range Status  09/28/2020 72 >59 mL/min/1.73 Final    Comment:    **In accordance with recommendations from the NKF-ASN Task force,**   [facility] is in the process of updating its eGFR calculation to the   2021 CKD-EPI creatinine equation that estimates kidney function   without a race variable.    GFR calc non Af Amer  Date Value Ref Range Status  09/28/2020 63 >59 mL/min/1.73 Final   eGFR  Date Value Ref Range Status  07/13/2023 56 (L) >59 mL/min/1.73 Final         Failed - B12 Level in normal range and within 720 days    No results found for: "VITAMINB12"       Passed - HBA1C is between 0 and 7.9 and within 180 days    HB A1C (BAYER DCA - WAIVED)  Date Value Ref Range Status  04/11/2023 7.3 (H) 4.8 - 5.6 % Final    Comment:             Prediabetes: 5.7 - 6.4          Diabetes: >6.4          Glycemic control for adults with diabetes: <7.0    Hgb A1c MFr Bld  Date Value Ref Range Status  07/13/2023 7.1 (H) 4.8 - 5.6 % Final    Comment:             Prediabetes: 5.7 - 6.4          Diabetes: >6.4          Glycemic control for adults with diabetes: <7.0          Passed - Valid encounter within last 6 months    Recent Outpatient Visits   None     Future Appointments             In 4 weeks Burkland, Megan P, DO [place] [facility]            Passed - CBC within normal limits and completed in the last 12 months    WBC  Date Value Ref Range Status  07/13/2023 5.6 3.4 - 10.8 x10E3/uL Final   RBC  Date Value Ref Range Status  07/13/2023 4.17 4.14 - 5.80 x10E6/uL Final   Hemoglobin  Date Value Ref Range Status  07/13/2023 13.2 13.0 - 17.7 g/dL Final   Hematocrit  Date Value Ref Range Status  07/13/2023 40.8 37.5 - 51.0 % Final   MCHC  Date Value Ref Range Status  07/13/2023 32.4 31.5 - 35.7  g/dL Final   [facility]  Date Value Ref Range Status  07/13/2023 31.7 26.6 - 33.0 pg Final   MCV  Date Value Ref Range Status  07/13/2023 98 (H) 79 - 97 fL Final   No results found for: "PLTCOUNTKUC", "LABPLAT", "POCPLA" RDW  Date Value Ref Range Status  07/13/2023 12.5 11.6 - 15.4 % Final          benazepril (LOTENSIN) 40 MG tablet [Pharmacy Med Name: BENAZEPRIL HCL 40 MG TABLET] 90 tablet 0    Sig: Take  1 tablet (40 mg total) by mouth daily.     Cardiovascular:  ACE Inhibitors Failed - 01/09/2024 11:26 AM      Failed - Cr in normal range and within 180 days    Creatinine, Ser  Date Value Ref Range Status  07/13/2023 1.32 (H) 0.76 - 1.27 mg/dL Final         Passed - K in normal range and within 180 days    Potassium  Date Value Ref Range Status  07/13/2023 4.8 3.5 - 5.2 mmol/L Final         Passed - Patient is not pregnant      Passed - Last BP in normal range    BP Readings from Last 1 Encounters:  07/13/23 106/70         Passed - Valid encounter within last 6 months    Recent Outpatient Visits   None     Future Appointments             In 4 weeks Dorcas Carrow, DO  [facility], [facility]

## 2024-02-07 ENCOUNTER — Ambulatory Visit: Payer: Self-pay | Admitting: Family Medicine

## 2024-02-07 ENCOUNTER — Encounter: Payer: Self-pay | Admitting: Family Medicine

## 2024-02-07 VITALS — BP 118/80 | HR 67 | Ht 65.0 in | Wt 209.0 lb

## 2024-02-07 DIAGNOSIS — R972 Elevated prostate specific antigen [PSA]: Secondary | ICD-10-CM

## 2024-02-07 DIAGNOSIS — E118 Type 2 diabetes mellitus with unspecified complications: Secondary | ICD-10-CM

## 2024-02-07 DIAGNOSIS — E7800 Pure hypercholesterolemia, unspecified: Secondary | ICD-10-CM

## 2024-02-07 DIAGNOSIS — I10 Essential (primary) hypertension: Secondary | ICD-10-CM

## 2024-02-07 DIAGNOSIS — Z7984 Long term (current) use of oral hypoglycemic drugs: Secondary | ICD-10-CM

## 2024-02-07 LAB — BAYER DCA HB A1C WAIVED: HB A1C (BAYER DCA - WAIVED): 7.4 % — ABNORMAL HIGH (ref 4.8–5.6)

## 2024-02-07 MED ORDER — BENAZEPRIL HCL 40 MG PO TABS: 40.0000 mg | ORAL_TABLET | Freq: Every day | ORAL | 1 refills | Status: DC

## 2024-02-07 MED ORDER — SIMVASTATIN 20 MG PO TABS: 20.0000 mg | ORAL_TABLET | Freq: Every day | ORAL | 1 refills | Status: DC

## 2024-02-07 MED ORDER — METFORMIN HCL ER 500 MG PO TB24: 1000.0000 mg | ORAL_TABLET | Freq: Two times a day (BID) | ORAL | 1 refills | Status: DC | PRN

## 2024-02-07 NOTE — Assessment & Plan Note (Signed)
 Under good control on current regimen. Continue current regimen. Continue to monitor. Call with any concerns. Refills given. Labs drawn today.

## 2024-02-07 NOTE — Progress Notes (Signed)
 BP 118/80 (BP Location: Left Arm, Patient Position: Sitting, Cuff Size: Large)   Pulse 67   Ht 5\' 5"  (1.651 m)   Wt 209 lb (94.8 kg)   SpO2 97%   BMI 34.78 kg/m    Subjective:    Patient ID: Ralph Chapman, male    DOB: 07-Jun-1946, 78 y.o.   MRN: [id number]  HPI: Ralph Chapman is a 78 y.o. male  Chief Complaint  Patient presents with   Diabetes   DIABETES- has held his metformin  for a few days to see what happens, going high after he eats no matter what Hypoglycemic episodes:no Polydipsia/polyuria: no Visual disturbance: no Chest pain: no Paresthesias: no Glucose Monitoring: yes  Accucheck frequency: 3-4x a day  Fasting glucose: 119 Taking Insulin?: no Blood Pressure Monitoring: not checking Retinal Examination: Up to Date Foot Exam: Up to Date Diabetic Education: Completed Pneumovax: Up to Date Influenza: Up to Date Aspirin: no  HYPERTENSION / HYPERLIPIDEMIA Satisfied with current treatment? yes Duration of hypertension: chronic BP monitoring frequency: not checking BP medication side effects: no Past BP meds: benazepril  Duration of hyperlipidemia: chronic Cholesterol medication side effects: no Cholesterol supplements: none Past cholesterol medications: simvastatin  Medication compliance: excellent compliance Aspirin: no Recent stressors: no Recurrent headaches: no Visual changes: no Palpitations: no Dyspnea: no Chest pain: no Lower extremity edema: no Dizzy/lightheaded: no  Relevant past medical, surgical, family and social history reviewed and updated as indicated. Interim medical history since our last visit reviewed. Allergies and medications reviewed and updated.  Review of Systems  Constitutional: Negative.   Respiratory: Negative.    Cardiovascular: Negative.   Musculoskeletal: Negative.   Neurological: Negative.   Psychiatric/Behavioral: Negative.      Per HPI unless specifically indicated above     Objective:    BP 118/80 (BP  Location: Left Arm, Patient Position: Sitting, Cuff Size: Large)   Pulse 67   Ht 5\' 5"  (1.651 m)   Wt 209 lb (94.8 kg)   SpO2 97%   BMI 34.78 kg/m   Wt Readings from Last 3 Encounters:  02/07/24 209 lb (94.8 kg)  12/05/23 207 lb (93.9 kg)  07/13/23 207 lb (93.9 kg)    Physical Exam Vitals and nursing note reviewed.  Constitutional:      General: He is not in acute distress.    Appearance: Normal appearance. He is not ill-appearing, toxic-appearing or diaphoretic.  HENT:     Head: Normocephalic and atraumatic.     Right Ear: External ear normal.     Left Ear: External ear normal.     Nose: Nose normal.     Mouth/Throat:     Mouth: Mucous membranes are moist.     Pharynx: Oropharynx is clear.  Eyes:     General: No scleral icterus.       Right eye: No discharge.        Left eye: No discharge.     Extraocular Movements: Extraocular movements intact.     Conjunctiva/sclera: Conjunctivae normal.     Pupils: Pupils are equal, round, and reactive to light.  Cardiovascular:     Rate and Rhythm: Normal rate and regular rhythm.     Pulses: Normal pulses.     Heart sounds: Normal heart sounds. No murmur heard.    No friction rub. No gallop.  Pulmonary:     Effort: Pulmonary effort is normal. No respiratory distress.     Breath sounds: Normal breath sounds. No stridor. No wheezing, rhonchi or rales.  Chest:     Chest wall: No tenderness.  Musculoskeletal:        General: Normal range of motion.     Cervical back: Normal range of motion and neck supple.  Skin:    General: Skin is warm and dry.     Capillary Refill: Capillary refill takes less than 2 seconds.     Coloration: Skin is not jaundiced or pale.     Findings: No bruising, erythema, lesion or rash.  Neurological:     General: No focal deficit present.     Mental Status: He is alert and oriented to person, place, and time. Mental status is at baseline.  Psychiatric:        Mood and Affect: Mood normal.         Behavior: Behavior normal.        Thought Content: Thought content normal.        Judgment: Judgment normal.     Results for orders placed or performed in visit on 12/20/23  HM DIABETES EYE EXAM   Collection Time: 12/20/23  2:11 PM  Result Value Ref Range   HM Diabetic Eye Exam No Retinopathy No Retinopathy      Assessment & Plan:   Problem List Items Addressed This Visit       Cardiovascular and Mediastinum   Hypertension   Under good control on current regimen. Continue current regimen. Continue to monitor. Call with any concerns. Refills given. Labs drawn today.        Relevant Medications   benazepril  (LOTENSIN ) 40 MG tablet   simvastatin  (ZOCOR ) 20 MG tablet   Other Relevant Orders   CBC with Differential/Platelet   Comprehensive metabolic panel with GFR     Endocrine   Controlled diabetes mellitus type 2 with complications (HCC) - Primary   Doing well with A1c of 7.4- up slightly. Will work on diet and exercise and recheck in 3 months. Call with any concerns.       Relevant Medications   benazepril  (LOTENSIN ) 40 MG tablet   metFORMIN  (GLUCOPHAGE -XR) 500 MG 24 hr tablet   simvastatin  (ZOCOR ) 20 MG tablet   Other Relevant Orders   CBC with Differential/Platelet   Bayer DCA Hb A1c Waived   Comprehensive metabolic panel with GFR     Other   Hyperlipidemia   Under good control on current regimen. Continue current regimen. Continue to monitor. Call with any concerns. Refills given. Labs drawn today.       Relevant Medications   benazepril  (LOTENSIN ) 40 MG tablet   simvastatin  (ZOCOR ) 20 MG tablet   Other Relevant Orders   CBC with Differential/Platelet   Lipid Panel w/o Chol/HDL Ratio   Comprehensive metabolic panel with GFR   Elevated prostate specific antigen (PSA)   Rechecking labs today. Await results. Treat as needed.       Relevant Orders   CBC with Differential/Platelet   Comprehensive metabolic panel with GFR   PSA     Follow up plan: Return  in about 3 months (around 05/08/2024).

## 2024-02-07 NOTE — Assessment & Plan Note (Signed)
 Doing well with A1c of 7.4- up slightly. Will work on diet and exercise and recheck in 3 months. Call with any concerns.

## 2024-02-07 NOTE — Assessment & Plan Note (Signed)
 Rechecking labs today. Await results. Treat as needed.

## 2024-02-08 ENCOUNTER — Encounter: Payer: Self-pay | Admitting: Family Medicine

## 2024-02-08 LAB — CBC WITH DIFFERENTIAL/PLATELET
Basophils Absolute: 0.1 10*3/uL (ref 0.0–0.2)
Basos: 2 %
EOS (ABSOLUTE): 0.3 10*3/uL (ref 0.0–0.4)
Eos: 5 %
Hematocrit: 41.8 % (ref 37.5–51.0)
Hemoglobin: 14 g/dL (ref 13.0–17.7)
Immature Grans (Abs): 0 10*3/uL (ref 0.0–0.1)
Immature Granulocytes: 0 %
Lymphocytes Absolute: 1.1 10*3/uL (ref 0.7–3.1)
Lymphs: 21 %
MCH: 32.1 pg (ref 26.6–33.0)
MCHC: 33.5 g/dL (ref 31.5–35.7)
MCV: 96 fL (ref 79–97)
Monocytes Absolute: 0.4 10*3/uL (ref 0.1–0.9)
Monocytes: 8 %
Neutrophils Absolute: 3.4 10*3/uL (ref 1.4–7.0)
Neutrophils: 64 %
Platelets: 277 10*3/uL (ref 150–450)
RBC: 4.36 x10E6/uL (ref 4.14–5.80)
RDW: 12.5 % (ref 11.6–15.4)
WBC: 5.4 10*3/uL (ref 3.4–10.8)

## 2024-02-08 LAB — LIPID PANEL W/O CHOL/HDL RATIO
Cholesterol, Total: 164 mg/dL (ref 100–199)
HDL: 41 mg/dL (ref >39)
LDL Chol Calc (NIH): 90 mg/dL (ref 0–99)
Triglycerides: 194 mg/dL — ABNORMAL HIGH (ref 0–149)
VLDL Cholesterol Cal: 33 mg/dL (ref 5–40)

## 2024-02-08 LAB — COMPREHENSIVE METABOLIC PANEL WITH GFR
ALT: 11 IU/L (ref 0–44)
AST: 21 IU/L (ref 0–40)
Albumin: 4.6 g/dL (ref 3.8–4.8)
Alkaline Phosphatase: 69 IU/L (ref 44–121)
BUN/Creatinine Ratio: 14 (ref 10–24)
BUN: 19 mg/dL (ref 8–27)
Bilirubin Total: 0.6 mg/dL (ref 0.0–1.2)
CO2: 19 mmol/L — ABNORMAL LOW (ref 20–29)
Calcium: 9.5 mg/dL (ref 8.6–10.2)
Chloride: 102 mmol/L (ref 96–106)
Creatinine, Ser: 1.32 mg/dL — ABNORMAL HIGH (ref 0.76–1.27)
Globulin, Total: 2.3 g/dL (ref 1.5–4.5)
Glucose: 124 mg/dL — ABNORMAL HIGH (ref 70–99)
Potassium: 4.9 mmol/L (ref 3.5–5.2)
Sodium: 140 mmol/L (ref 134–144)
Total Protein: 6.9 g/dL (ref 6.0–8.5)
eGFR: 56 mL/min/{1.73_m2} — ABNORMAL LOW (ref >59)

## 2024-02-08 LAB — PSA: Prostate Specific Ag, Serum: 5.1 ng/mL — ABNORMAL HIGH (ref 0.0–4.0)

## 2024-05-21 ENCOUNTER — Ambulatory Visit: Admitting: Family Medicine

## 2024-05-21 ENCOUNTER — Encounter: Payer: Self-pay | Admitting: Family Medicine

## 2024-05-21 VITALS — BP 119/78 | HR 70 | Temp 98.1°F | Ht 65.0 in | Wt 208.6 lb

## 2024-05-21 DIAGNOSIS — N138 Other obstructive and reflux uropathy: Secondary | ICD-10-CM

## 2024-05-21 DIAGNOSIS — Z23 Encounter for immunization: Secondary | ICD-10-CM | POA: Diagnosis not present

## 2024-05-21 DIAGNOSIS — S81801A Unspecified open wound, right lower leg, initial encounter: Secondary | ICD-10-CM | POA: Diagnosis not present

## 2024-05-21 DIAGNOSIS — E118 Type 2 diabetes mellitus with unspecified complications: Secondary | ICD-10-CM | POA: Diagnosis not present

## 2024-05-21 DIAGNOSIS — N401 Enlarged prostate with lower urinary tract symptoms: Secondary | ICD-10-CM | POA: Diagnosis not present

## 2024-05-21 DIAGNOSIS — Z7984 Long term (current) use of oral hypoglycemic drugs: Secondary | ICD-10-CM

## 2024-05-21 LAB — BAYER DCA HB A1C WAIVED: HB A1C (BAYER DCA - WAIVED): 7.6 % — ABNORMAL HIGH (ref 4.8–5.6)

## 2024-05-21 LAB — MICROALBUMIN, URINE WAIVED
Creatinine, Urine Waived: 300 mg/dL (ref 10–300)
Microalb, Ur Waived: 80 mg/L — ABNORMAL HIGH (ref 0–19)

## 2024-05-21 MED ORDER — EMPAGLIFLOZIN 25 MG PO TABS: 25.0000 mg | ORAL_TABLET | Freq: Every day | ORAL | 0 refills | Status: DC

## 2024-05-21 MED ORDER — EMPAGLIFLOZIN 25 MG PO TABS: 25.0000 mg | ORAL_TABLET | Freq: Every day | ORAL | 3 refills | Status: DC

## 2024-05-21 NOTE — Progress Notes (Signed)
 BP 119/78   Pulse 70   Temp 98.1 F (36.7 C) (Oral)   Ht 5' 5 (1.651 m)   Wt 208 lb 9.6 oz (94.6 kg)   SpO2 94%   BMI 34.71 kg/m    Subjective:    Patient ID: Ralph Chapman, male    DOB: December 08, 1945, 78 y.o.   MRN: [id number]  HPI: Ralph Chapman is a 78 y.o. male  Chief Complaint  Patient presents with   Diabetes   DIABETES Hypoglycemic episodes:no Polydipsia/polyuria: no Visual disturbance: no Chest pain: no Paresthesias: no Glucose Monitoring: yes  Accucheck frequency: Daily Taking Insulin?: no Blood Pressure Monitoring: occasionally Retinal Examination: Up to Date Foot Exam: Up to Date Diabetic Education: Completed Pneumovax: Up to Date Influenza: Will get when available Aspirin: no  Relevant past medical, surgical, family and social history reviewed and updated as indicated. Interim medical history since our last visit reviewed. Allergies and medications reviewed and updated.  Review of Systems  Constitutional: Negative.   Respiratory: Negative.    Cardiovascular: Negative.   Musculoskeletal: Negative.   Neurological: Negative.   Psychiatric/Behavioral: Negative.      Per HPI unless specifically indicated above     Objective:    BP 119/78   Pulse 70   Temp 98.1 F (36.7 C) (Oral)   Ht 5' 5 (1.651 m)   Wt 208 lb 9.6 oz (94.6 kg)   SpO2 94%   BMI 34.71 kg/m   Wt Readings from Last 3 Encounters:  05/21/24 208 lb 9.6 oz (94.6 kg)  02/07/24 209 lb (94.8 kg)  12/05/23 207 lb (93.9 kg)    Physical Exam Vitals and nursing note reviewed.  Constitutional:      General: He is not in acute distress.    Appearance: Normal appearance. He is not ill-appearing, toxic-appearing or diaphoretic.  HENT:     Head: Normocephalic and atraumatic.     Right Ear: External ear normal.     Left Ear: External ear normal.     Nose: Nose normal.     Mouth/Throat:     Mouth: Mucous membranes are moist.     Pharynx: Oropharynx is clear.  Eyes:      General: No scleral icterus.       Right eye: No discharge.        Left eye: No discharge.     Extraocular Movements: Extraocular movements intact.     Conjunctiva/sclera: Conjunctivae normal.     Pupils: Pupils are equal, round, and reactive to light.  Cardiovascular:     Rate and Rhythm: Normal rate and regular rhythm.     Pulses: Normal pulses.     Heart sounds: Normal heart sounds. No murmur heard.    No friction rub. No gallop.  Pulmonary:     Effort: Pulmonary effort is normal. No respiratory distress.     Breath sounds: Normal breath sounds. No stridor. No wheezing, rhonchi or rales.  Chest:     Chest wall: No tenderness.  Musculoskeletal:        General: Normal range of motion.     Cervical back: Normal range of motion and neck supple.  Skin:    General: Skin is warm and dry.     Capillary Refill: Capillary refill takes less than 2 seconds.     Coloration: Skin is not jaundiced or pale.     Findings: No bruising, erythema, lesion or rash.  Neurological:     General: No focal deficit present.  Mental Status: He is alert and oriented to person, place, and time. Mental status is at baseline.  Psychiatric:        Mood and Affect: Mood normal.        Behavior: Behavior normal.        Thought Content: Thought content normal.        Judgment: Judgment normal.     Results for orders placed or performed in visit on 02/07/24  Bayer DCA Hb A1c Waived   Collection Time: 02/07/24  9:54 AM  Result Value Ref Range   HB A1C (BAYER DCA - WAIVED) 7.4 (H) 4.8 - 5.6 %  CBC with Differential/Platelet   Collection Time: 02/07/24  9:55 AM  Result Value Ref Range   WBC 5.4 3.4 - 10.8 x10E3/uL   RBC 4.36 4.14 - 5.80 x10E6/uL   Hemoglobin 14.0 13.0 - 17.7 g/dL   Hematocrit 58.1 62.4 - 51.0 %   MCV 96 79 - 97 fL   MCH 32.1 26.6 - 33.0 pg   MCHC 33.5 31.5 - 35.7 g/dL   RDW 87.4 88.3 - 84.5 %   Platelets 277 150 - 450 x10E3/uL   Neutrophils 64 Not Estab. %   Lymphs 21 Not Estab. %    Monocytes 8 Not Estab. %   Eos 5 Not Estab. %   Basos 2 Not Estab. %   Neutrophils Absolute 3.4 1.4 - 7.0 x10E3/uL   Lymphocytes Absolute 1.1 0.7 - 3.1 x10E3/uL   Monocytes Absolute 0.4 0.1 - 0.9 x10E3/uL   EOS (ABSOLUTE) 0.3 0.0 - 0.4 x10E3/uL   Basophils Absolute 0.1 0.0 - 0.2 x10E3/uL   Immature Granulocytes 0 Not Estab. %   Immature Grans (Abs) 0.0 0.0 - 0.1 x10E3/uL  Lipid Panel w/o Chol/HDL Ratio   Collection Time: 02/07/24  9:55 AM  Result Value Ref Range   Cholesterol, Total 164 100 - 199 mg/dL   Triglycerides 805 (H) 0 - 149 mg/dL   HDL 41 >60 mg/dL   VLDL Cholesterol Cal 33 5 - 40 mg/dL   LDL Chol Calc (NIH) 90 0 - 99 mg/dL  Comprehensive metabolic panel with GFR   Collection Time: 02/07/24  9:55 AM  Result Value Ref Range   Glucose 124 (H) 70 - 99 mg/dL   BUN 19 8 - 27 mg/dL   Creatinine, Ser 8.67 (H) 0.76 - 1.27 mg/dL   eGFR 56 (L) >40 fO/fpw/8.26   BUN/Creatinine Ratio 14 10 - 24   Sodium 140 134 - 144 mmol/L   Potassium 4.9 3.5 - 5.2 mmol/L   Chloride 102 96 - 106 mmol/L   CO2 19 (L) 20 - 29 mmol/L   Calcium 9.5 8.6 - 10.2 mg/dL   Total Protein 6.9 6.0 - 8.5 g/dL   Albumin 4.6 3.8 - 4.8 g/dL   Globulin, Total 2.3 1.5 - 4.5 g/dL   Bilirubin Total 0.6 0.0 - 1.2 mg/dL   Alkaline Phosphatase 69 44 - 121 IU/L   AST 21 0 - 40 IU/L   ALT 11 0 - 44 IU/L  PSA   Collection Time: 02/07/24  9:55 AM  Result Value Ref Range   Prostate Specific Ag, Serum 5.1 (H) 0.0 - 4.0 ng/mL      Assessment & Plan:   Problem List Items Addressed This Visit       Endocrine   Controlled diabetes mellitus type 2 with complications (HCC) - Primary   Up slightly with A1c of 7.6. Will add jardiance  and recheck in  3 months. Call with any concerns.       Relevant Medications   empagliflozin  (JARDIANCE ) 25 MG TABS tablet   Other Relevant Orders   Bayer DCA Hb A1c Waived   Microalbumin, Urine Waived     Genitourinary   Benign localized hyperplasia of prostate with urinary  obstruction   Rechecking labs today. Await results.       Relevant Orders   PSA   Other Visit Diagnoses       Open wound of right lower leg, initial encounter       Healing well. Due for Td. Given today.   Relevant Orders   Td : Tetanus/diphtheria >7yo Preservative  free        Follow up plan: Return in about 3 months (around 08/21/2024) for physical.

## 2024-05-21 NOTE — Assessment & Plan Note (Signed)
 Up slightly with A1c of 7.6. Will add jardiance  and recheck in 3 months. Call with any concerns.

## 2024-05-21 NOTE — Assessment & Plan Note (Signed)
 Rechecking labs today. Await results.

## 2024-05-22 LAB — PSA: Prostate Specific Ag, Serum: 5 ng/mL — ABNORMAL HIGH (ref 0.0–4.0)

## 2024-05-26 ENCOUNTER — Ambulatory Visit: Payer: Self-pay | Admitting: Family Medicine

## 2024-08-12 ENCOUNTER — Other Ambulatory Visit: Payer: Self-pay | Admitting: Family Medicine

## 2024-08-12 DIAGNOSIS — E118 Type 2 diabetes mellitus with unspecified complications: Secondary | ICD-10-CM

## 2024-08-13 ENCOUNTER — Telehealth: Payer: Self-pay | Admitting: Family Medicine

## 2024-08-13 DIAGNOSIS — R251 Tremor, unspecified: Secondary | ICD-10-CM

## 2024-08-13 NOTE — Telephone Encounter (Signed)
 Copied from CRM [id number]. Topic: Referral - Request for Referral >> Aug 13, 2024 10:37 AM Darshell M wrote: Did the patient discuss referral with their provider in the last year? Yes (If No - schedule appointment) (If Yes - send message)  Appointment offered? No - Patient's daughter, Dr. Annabella Kiang (neurologist) calling for patient, [phone]  Type of order/referral and detailed reason for visit: Neurology referral for tremor.   Preference of office, provider, location: Dr. [facility], Phone# [phone], Fax #: [phone]  If referral order, have you been seen by this specialty before? No (If Yes, this issue or another issue? When? Where?  Can we respond through MyChart? No

## 2024-08-13 NOTE — Telephone Encounter (Signed)
 Requested medications are due for refill today.  yes  Requested medications are on the active medications list.  yes  Last refill. 07/13/2023 #300 3 rf  Future visit scheduled.   yes  Notes to clinic.  Protocol will not attach.    Requested Prescriptions  Pending Prescriptions Disp Refills   ACCU-CHEK GUIDE TEST test strip [Pharmacy Med Name: ACCU-CHEK GUIDE TEST STRIP 100] 300 strip 0    Sig: 1 each by Other route 3 (three) times daily. Use as instructed     There is no refill protocol information for this order

## 2024-08-14 DIAGNOSIS — L570 Actinic keratosis: Secondary | ICD-10-CM | POA: Diagnosis not present

## 2024-08-14 DIAGNOSIS — D485 Neoplasm of uncertain behavior of skin: Secondary | ICD-10-CM | POA: Diagnosis not present

## 2024-08-14 NOTE — Telephone Encounter (Signed)
 Referral placed.

## 2024-08-14 NOTE — Telephone Encounter (Signed)
 Routing to provider to advise. Can this referral be entered for the patient?

## 2024-08-28 DIAGNOSIS — L570 Actinic keratosis: Secondary | ICD-10-CM | POA: Diagnosis not present

## 2024-09-17 ENCOUNTER — Encounter: Admitting: Family Medicine

## 2024-09-18 ENCOUNTER — Ambulatory Visit: Admitting: Family Medicine

## 2024-09-18 ENCOUNTER — Encounter: Payer: Self-pay | Admitting: Family Medicine

## 2024-09-18 VITALS — BP 124/74 | HR 66 | Temp 98.0°F | Ht 65.0 in | Wt 208.4 lb

## 2024-09-18 DIAGNOSIS — Z0000 Encounter for general adult medical examination without abnormal findings: Secondary | ICD-10-CM

## 2024-09-18 DIAGNOSIS — E1129 Type 2 diabetes mellitus with other diabetic kidney complication: Secondary | ICD-10-CM

## 2024-09-18 DIAGNOSIS — I10 Essential (primary) hypertension: Secondary | ICD-10-CM

## 2024-09-18 DIAGNOSIS — R809 Proteinuria, unspecified: Secondary | ICD-10-CM | POA: Diagnosis not present

## 2024-09-18 DIAGNOSIS — Z23 Encounter for immunization: Secondary | ICD-10-CM

## 2024-09-18 DIAGNOSIS — R972 Elevated prostate specific antigen [PSA]: Secondary | ICD-10-CM

## 2024-09-18 DIAGNOSIS — E118 Type 2 diabetes mellitus with unspecified complications: Secondary | ICD-10-CM | POA: Diagnosis not present

## 2024-09-18 DIAGNOSIS — E7800 Pure hypercholesterolemia, unspecified: Secondary | ICD-10-CM

## 2024-09-18 DIAGNOSIS — Z7984 Long term (current) use of oral hypoglycemic drugs: Secondary | ICD-10-CM

## 2024-09-18 LAB — MICROALBUMIN, URINE WAIVED
Creatinine, Urine Waived: 200 mg/dL (ref 10–300)
Microalb, Ur Waived: 30 mg/L — ABNORMAL HIGH (ref 0–19)
Microalb/Creat Ratio: <30 mg/g (ref <30)

## 2024-09-18 LAB — BAYER DCA HB A1C WAIVED: HB A1C (BAYER DCA - WAIVED): 7.9 % — ABNORMAL HIGH (ref 4.8–5.6)

## 2024-09-18 MED ORDER — SIMVASTATIN 20 MG PO TABS: 20.0000 mg | ORAL_TABLET | Freq: Every day | ORAL | 1 refills | Status: AC

## 2024-09-18 MED ORDER — METFORMIN HCL ER 500 MG PO TB24: 1000.0000 mg | ORAL_TABLET | Freq: Two times a day (BID) | ORAL | 1 refills | Status: AC | PRN

## 2024-09-18 MED ORDER — BENAZEPRIL HCL 40 MG PO TABS: 40.0000 mg | ORAL_TABLET | Freq: Every day | ORAL | 1 refills | Status: AC

## 2024-09-18 NOTE — Assessment & Plan Note (Signed)
 Under good control on current regimen. Continue current regimen. Continue to monitor. Call with any concerns. Refills given. Labs drawn today.

## 2024-09-18 NOTE — Assessment & Plan Note (Signed)
 A1c up at 7.9. Will really work on diet and hold off on jardiance  due to increased frequency. Recheck in 3 months. Call with any concerns.

## 2024-09-18 NOTE — Progress Notes (Signed)
 BP 124/74   Pulse 66   Temp 98 F (36.7 C) (Oral)   Ht 5' 5 (1.651 m)   Wt 208 lb 6.4 oz (94.5 kg)   SpO2 95%   BMI 34.68 kg/m    Subjective:    Patient ID: Ralph Chapman, male    DOB: Dec 28, 1945, 78 y.o.   MRN: [id number]  HPI: Ralph Chapman is a 78 y.o. male presenting on 09/18/2024 for comprehensive medical examination. Current medical complaints include:  DIABETES- stopped his jardiance  about a month ago due to increased urinary frequency Hypoglycemic episodes:no Polydipsia/polyuria: no Visual disturbance: no Chest pain: no Paresthesias: no Glucose Monitoring: yes  Accucheck frequency: TID  Fasting glucose: 110-135 Taking Insulin?: no Blood Pressure Monitoring: not checking Retinal Examination: Up to Date Foot Exam: Up to Date Diabetic Education: Completed Pneumovax: Up to Date Influenza: Up to Date Aspirin: no  HYPERTENSION / HYPERLIPIDEMIA Satisfied with current treatment? yes Duration of hypertension: chronic BP monitoring frequency: not checking BP medication side effects: no Past BP meds: benazepril  Duration of hyperlipidemia: chronic Cholesterol medication side effects: no Cholesterol supplements: none Past cholesterol medications: simvastatin  Medication compliance: excellent compliance Aspirin: no Recent stressors: no Recurrent headaches: no Visual changes: no Palpitations: no Dyspnea: no Chest pain: no Lower extremity edema: no Dizzy/lightheaded: no   He currently lives with: wife Interim Problems from his last visit: no  Depression Screen done today and results listed below:     09/18/2024    1:28 PM 05/21/2024    8:04 AM 02/07/2024    9:52 AM 12/05/2023    9:19 AM 07/13/2023    8:16 AM  Depression screen PHQ 2/9  Decreased Interest 0 0 0 0 0  Down, Depressed, Hopeless 0 0 0 0 0  PHQ - 2 Score 0 0 0 0 0  Altered sleeping 0 0 0    Tired, decreased energy 0 0 0    Change in appetite 0 0 0    Feeling bad or failure about  yourself  0 0 0    Trouble concentrating 0 0 0    Moving slowly or fidgety/restless 0 0 0    Suicidal thoughts 0 0 0    PHQ-9 Score 0 0  0     Difficult doing work/chores   Not difficult at all       Data saved with a previous flowsheet row definition    Past Medical History:  Past Medical History:  Diagnosis Date   Allergy Dont know   Diabetes mellitus without complication (HCC)    Hyperlipidemia    Hypertension     Surgical History:  Past Surgical History:  Procedure Laterality Date   APPENDECTOMY     COLON SURGERY  2017   Colon cancer surgery   COLONOSCOPY WITH PROPOFOL  N/A 09/05/2016   Procedure: COLONOSCOPY WITH PROPOFOL ;  Surgeon: Rogelia Copping, MD;  Location: [facility];  Service: Endoscopy;  Laterality: N/A;  Diabetic - oral meds   POLYPECTOMY  09/05/2016   Procedure: POLYPECTOMY;  Surgeon: Rogelia Copping, MD;  Location: [facility];  Service: Endoscopy;;   TONSILLECTOMY      Medications:  Current Outpatient Medications on File Prior to Visit  Medication Sig   ACCU-CHEK GUIDE TEST test strip 1 each by Other route 3 (three) times daily. Use as instructed   Cholecalciferol 125 MCG (5000 UT) TABS Take by mouth.   glucosamine-chondroitin 500-400 MG tablet Take by mouth.   No current facility-administered medications on file  prior to visit.    Allergies:  Allergies  Allergen Reactions   Aspirin Hives, Shortness Of Breath and Swelling    Lips swell   Niacin And Related Swelling   Tramadol Nausea And Vomiting    Social History:  Social History   Socioeconomic History   Marital status: Married    Spouse name: Beverley   Number of children: 2   Years of education: Not on file   Highest education level: Associate degree: occupational, scientist, product/process development, or vocational program  Occupational History   Occupation: retired  Tobacco Use   Smoking status: Never   Smokeless tobacco: Never  Vaping Use   Vaping status: Never Used  Substance and Sexual Activity    Alcohol use: Never   Drug use: Never   Sexual activity: Yes    Birth control/protection: None  Other Topics Concern   Not on file  Social History Narrative   Not on file   Social Drivers of Health   Financial Resource Strain: Low Risk  (09/13/2024)   Overall Financial Resource Strain (CARDIA)    Difficulty of Paying Living Expenses: Not hard at all  Food Insecurity: No Food Insecurity (09/13/2024)   Hunger Vital Sign    Worried About Running Out of Food in the Last Year: Never true    Ran Out of Food in the Last Year: Never true  Transportation Needs: No Transportation Needs (09/13/2024)   PRAPARE - Administrator, Civil Service (Medical): No    Lack of Transportation (Non-Medical): No  Physical Activity: Insufficiently Active (09/13/2024)   Exercise Vital Sign    Days of Exercise per Week: 4 days    Minutes of Exercise per Session: 20 min  Stress: No Stress Concern Present (09/13/2024)   [employer] of Occupational Health - Occupational Stress Questionnaire    Feeling of Stress: Not at all  Social Connections: Socially Integrated (09/13/2024)   Social Connection and Isolation Panel    Frequency of Communication with Friends and Family: Three times a week    Frequency of Social Gatherings with Friends and Family: Once a week    Attends Religious Services: More than 4 times per year    Active Member of [employer] or Organizations: Yes    Attends Engineer, Structural: More than 4 times per year    Marital Status: Married  Catering Manager Violence: Not At Risk (09/18/2024)   Humiliation, Afraid, Rape, and Kick questionnaire    Fear of Current or Ex-Partner: No    Emotionally Abused: No    Physically Abused: No    Sexually Abused: No   Social History   Tobacco Use  Smoking Status Never  Smokeless Tobacco Never   Social History   Substance and Sexual Activity  Alcohol Use Never    Family History:  Family History  Problem Relation Age of Onset    Cancer Mother    Heart attack Father 67   Heart disease Brother     Past medical history, surgical history, medications, allergies, family history and social history reviewed with patient today and changes made to appropriate areas of the chart.   Review of Systems  Constitutional: Negative.   HENT: Negative.    Eyes: Negative.   Respiratory: Negative.    Cardiovascular: Negative.   Gastrointestinal:  Positive for diarrhea. Negative for abdominal pain, blood in stool, constipation, heartburn, melena, nausea and vomiting.  Genitourinary: Negative.   Musculoskeletal: Negative.   Skin: Negative.   Neurological:  Positive for  tremors. Negative for dizziness, tingling, sensory change, speech change, focal weakness, seizures, loss of consciousness, weakness and headaches.  Endo/Heme/Allergies: Negative.   Psychiatric/Behavioral: Negative.     All other ROS negative except what is listed above and in the HPI.      Objective:    BP 124/74   Pulse 66   Temp 98 F (36.7 C) (Oral)   Ht 5' 5 (1.651 m)   Wt 208 lb 6.4 oz (94.5 kg)   SpO2 95%   BMI 34.68 kg/m   Wt Readings from Last 3 Encounters:  09/18/24 208 lb 6.4 oz (94.5 kg)  05/21/24 208 lb 9.6 oz (94.6 kg)  02/07/24 209 lb (94.8 kg)    Physical Exam Vitals and nursing note reviewed.  Constitutional:      General: He is not in acute distress.    Appearance: Normal appearance. He is obese. He is not ill-appearing, toxic-appearing or diaphoretic.  HENT:     Head: Normocephalic and atraumatic.     Right Ear: Tympanic membrane, ear canal and external ear normal. There is no impacted cerumen.     Left Ear: Tympanic membrane, ear canal and external ear normal. There is no impacted cerumen.     Nose: Nose normal. No congestion or rhinorrhea.     Mouth/Throat:     Mouth: Mucous membranes are moist.     Pharynx: Oropharynx is clear. No oropharyngeal exudate or posterior oropharyngeal erythema.  Eyes:     General: No scleral  icterus.       Right eye: No discharge.        Left eye: No discharge.     Extraocular Movements: Extraocular movements intact.     Conjunctiva/sclera: Conjunctivae normal.     Pupils: Pupils are equal, round, and reactive to light.  Neck:     Vascular: No carotid bruit.  Cardiovascular:     Rate and Rhythm: Normal rate and regular rhythm.     Pulses: Normal pulses.     Heart sounds: No murmur heard.    No friction rub. No gallop.  Pulmonary:     Effort: Pulmonary effort is normal. No respiratory distress.     Breath sounds: Normal breath sounds. No stridor. No wheezing, rhonchi or rales.  Chest:     Chest wall: No tenderness.  Abdominal:     General: Abdomen is flat. Bowel sounds are normal. There is no distension.     Palpations: Abdomen is soft. There is no mass.     Tenderness: There is no abdominal tenderness. There is no right CVA tenderness, left CVA tenderness, guarding or rebound.     Hernia: No hernia is present.  Genitourinary:    Comments: Genital exam deferred with shared decision making Musculoskeletal:        General: No swelling, tenderness, deformity or signs of injury.     Cervical back: Normal range of motion and neck supple. No rigidity. No muscular tenderness.     Right lower leg: No edema.     Left lower leg: No edema.  Lymphadenopathy:     Cervical: No cervical adenopathy.  Skin:    General: Skin is warm and dry.     Capillary Refill: Capillary refill takes less than 2 seconds.     Coloration: Skin is not jaundiced or pale.     Findings: No bruising, erythema, lesion or rash.  Neurological:     General: No focal deficit present.     Mental Status: He is alert  and oriented to person, place, and time.     Cranial Nerves: No cranial nerve deficit.     Sensory: No sensory deficit.     Motor: No weakness.     Coordination: Coordination normal.     Gait: Gait normal.     Deep Tendon Reflexes: Reflexes normal.  Psychiatric:        Mood and Affect: Mood  normal.        Behavior: Behavior normal.        Thought Content: Thought content normal.        Judgment: Judgment normal.     Results for orders placed or performed in visit on 05/21/24  Bayer DCA Hb A1c Waived   Collection Time: 05/21/24  8:26 AM  Result Value Ref Range   HB A1C (BAYER DCA - WAIVED) 7.6 (H) 4.8 - 5.6 %  Microalbumin, Urine Waived   Collection Time: 05/21/24  8:26 AM  Result Value Ref Range   Microalb, Ur Waived 80 (H) 0 - 19 mg/L   Creatinine, Urine Waived 300 10 - 300 mg/dL   Microalb/Creat Ratio 30-300 (H) <30 mg/g  PSA   Collection Time: 05/21/24  8:27 AM  Result Value Ref Range   Prostate Specific Ag, Serum 5.0 (H) 0.0 - 4.0 ng/mL      Assessment & Plan:   Problem List Items Addressed This Visit       Cardiovascular and Mediastinum   Hypertension   Under good control on current regimen. Continue current regimen. Continue to monitor. Call with any concerns. Refills given. Labs drawn today.        Relevant Medications   benazepril  (LOTENSIN ) 40 MG tablet   simvastatin  (ZOCOR ) 20 MG tablet   Other Relevant Orders   Comprehensive metabolic panel with GFR   CBC with Differential/Platelet   TSH     Endocrine   Controlled type 2 diabetes mellitus with microalbuminuria (HCC)   A1c up at 7.9. Will really work on diet and hold off on jardiance  due to increased frequency. Recheck in 3 months. Call with any concerns.       Relevant Medications   benazepril  (LOTENSIN ) 40 MG tablet   metFORMIN  (GLUCOPHAGE -XR) 500 MG 24 hr tablet   simvastatin  (ZOCOR ) 20 MG tablet   Other Relevant Orders   Comprehensive metabolic panel with GFR   CBC with Differential/Platelet   Microalbumin, Urine Waived   Bayer DCA Hb A1c Waived     Other   Hyperlipidemia   Under good control on current regimen. Continue current regimen. Continue to monitor. Call with any concerns. Refills given. Labs drawn today.       Relevant Medications   benazepril  (LOTENSIN ) 40 MG  tablet   simvastatin  (ZOCOR ) 20 MG tablet   Other Relevant Orders   Comprehensive metabolic panel with GFR   CBC with Differential/Platelet   Lipid Panel w/o Chol/HDL Ratio   Elevated prostate specific antigen (PSA)   Rechecking labs today. Await results. Treat as needed.       Relevant Orders   PSA   Other Visit Diagnoses       Routine general medical examination at a health care facility    -  Primary   Vaccines up to date. Screening labs checked today. Continue diet and exercise. Call with any concerns.     Needs flu shot       Flu shot given today.   Relevant Orders   Flu vaccine HIGH DOSE PF(Fluzone Trivalent)  Discussed aspirin prophylaxis for myocardial infarction prevention.   LABORATORY TESTING:  Health maintenance labs ordered today as discussed above.   The natural history of prostate cancer and ongoing controversy regarding screening and potential treatment outcomes of prostate cancer has been discussed with the patient. The meaning of a false positive PSA and a false negative PSA has been discussed. He indicates understanding of the limitations of this screening test and wishes to proceed with screening PSA testing.   IMMUNIZATIONS:   - Tdap: Tetanus vaccination status reviewed: last tetanus booster within 10 years. - Influenza: Administered today - Pneumovax: Up to date - Prevnar: Up to date - COVID: Refused - HPV: Not applicable - Shingrix vaccine: Up to date  SCREENING: - Colonoscopy: Up to date  Discussed with patient purpose of the colonoscopy is to detect colon cancer at curable precancerous or early stages   PATIENT COUNSELING:    Sexuality: Discussed sexually transmitted diseases, partner selection, use of condoms, avoidance of unintended pregnancy  and contraceptive alternatives.   Advised to avoid cigarette smoking.  I discussed with the patient that most people either abstain from alcohol or drink within safe limits (<=14/week and <=4  drinks/occasion for males, <=7/weeks and <= 3 drinks/occasion for females) and that the risk for alcohol disorders and other health effects rises proportionally with the number of drinks per week and how often a drinker exceeds daily limits.  Discussed cessation/primary prevention of drug use and availability of treatment for abuse.   Diet: Encouraged to adjust caloric intake to maintain  or achieve ideal body weight, to reduce intake of dietary saturated fat and total fat, to limit sodium intake by avoiding high sodium foods and not adding table salt, and to maintain adequate dietary potassium and calcium preferably from fresh fruits, vegetables, and low-fat dairy products.    stressed the importance of regular exercise  Injury prevention: Discussed safety belts, safety helmets, smoke detector, smoking near bedding or upholstery.   Dental health: Discussed importance of regular tooth brushing, flossing, and dental visits.   Follow up plan: NEXT PREVENTATIVE PHYSICAL DUE IN 1 YEAR. Return in about 3 months (around 12/17/2024).

## 2024-09-18 NOTE — Assessment & Plan Note (Signed)
 Rechecking labs today. Await results. Treat as needed.

## 2024-09-19 ENCOUNTER — Ambulatory Visit: Payer: Self-pay | Admitting: Family Medicine

## 2024-09-19 DIAGNOSIS — R972 Elevated prostate specific antigen [PSA]: Secondary | ICD-10-CM

## 2024-09-19 LAB — COMPREHENSIVE METABOLIC PANEL WITH GFR
ALT: 15 IU/L (ref 0–44)
AST: 20 IU/L (ref 0–40)
Albumin: 4.5 g/dL (ref 3.8–4.8)
Alkaline Phosphatase: 77 IU/L (ref 47–123)
BUN/Creatinine Ratio: 18 (ref 10–24)
BUN: 23 mg/dL (ref 8–27)
Bilirubin Total: 0.7 mg/dL (ref 0.0–1.2)
CO2: 19 mmol/L — ABNORMAL LOW (ref 20–29)
Calcium: 9.6 mg/dL (ref 8.6–10.2)
Chloride: 102 mmol/L (ref 96–106)
Creatinine, Ser: 1.25 mg/dL (ref 0.76–1.27)
Globulin, Total: 2.7 g/dL (ref 1.5–4.5)
Glucose: 129 mg/dL — ABNORMAL HIGH (ref 70–99)
Potassium: 4.9 mmol/L (ref 3.5–5.2)
Sodium: 138 mmol/L (ref 134–144)
Total Protein: 7.2 g/dL (ref 6.0–8.5)
eGFR: 60 mL/min/1.73 (ref >59)

## 2024-09-19 LAB — LIPID PANEL W/O CHOL/HDL RATIO
Cholesterol, Total: 172 mg/dL (ref 100–199)
HDL: 46 mg/dL (ref >39)
LDL Chol Calc (NIH): 91 mg/dL (ref 0–99)
Triglycerides: 208 mg/dL — ABNORMAL HIGH (ref 0–149)
VLDL Cholesterol Cal: 35 mg/dL (ref 5–40)

## 2024-09-19 LAB — CBC WITH DIFFERENTIAL/PLATELET
Basophils Absolute: 0.1 x10E3/uL (ref 0.0–0.2)
Basos: 2 %
EOS (ABSOLUTE): 0.2 x10E3/uL (ref 0.0–0.4)
Eos: 4 %
Hematocrit: 42.7 % (ref 37.5–51.0)
Hemoglobin: 14.4 g/dL (ref 13.0–17.7)
Immature Grans (Abs): 0 x10E3/uL (ref 0.0–0.1)
Immature Granulocytes: 0 %
Lymphocytes Absolute: 1.4 x10E3/uL (ref 0.7–3.1)
Lymphs: 23 %
MCH: 32.1 pg (ref 26.6–33.0)
MCHC: 33.7 g/dL (ref 31.5–35.7)
MCV: 95 fL (ref 79–97)
Monocytes Absolute: 0.5 x10E3/uL (ref 0.1–0.9)
Monocytes: 8 %
Neutrophils Absolute: 3.9 x10E3/uL (ref 1.4–7.0)
Neutrophils: 63 %
Platelets: 288 x10E3/uL (ref 150–450)
RBC: 4.48 x10E6/uL (ref 4.14–5.80)
RDW: 12.6 % (ref 11.6–15.4)
WBC: 6.2 x10E3/uL (ref 3.4–10.8)

## 2024-09-19 LAB — PSA: Prostate Specific Ag, Serum: 5.8 ng/mL — ABNORMAL HIGH (ref 0.0–4.0)

## 2024-09-19 LAB — TSH: TSH: 1.99 u[IU]/mL (ref 0.450–4.500)

## 2024-10-21 ENCOUNTER — Other Ambulatory Visit

## 2024-10-21 DIAGNOSIS — R972 Elevated prostate specific antigen [PSA]: Secondary | ICD-10-CM

## 2024-10-22 ENCOUNTER — Ambulatory Visit: Payer: Self-pay | Admitting: Family Medicine

## 2024-10-22 LAB — PSA: Prostate Specific Ag, Serum: 5.6 ng/mL — ABNORMAL HIGH (ref 0.0–4.0)

## 2024-11-15 ENCOUNTER — Other Ambulatory Visit: Payer: Self-pay | Admitting: Nurse Practitioner

## 2024-11-15 DIAGNOSIS — E118 Type 2 diabetes mellitus with unspecified complications: Secondary | ICD-10-CM

## 2024-12-10 ENCOUNTER — Ambulatory Visit: Payer: Medicare Other

## 2024-12-18 ENCOUNTER — Ambulatory Visit: Admitting: Family Medicine
